# Patient Record
Sex: Female | Born: 1965 | Hispanic: No | Marital: Married | Smoking: Never smoker
Health system: Southern US, Community
[De-identification: ages and names within clinical notes are randomized; demographics above are authoritative.]

## PROBLEM LIST (undated history)

## (undated) DIAGNOSIS — K3533 Acute appendicitis with perforation and localized peritonitis, with abscess: Secondary | ICD-10-CM

## (undated) DIAGNOSIS — K219 Gastro-esophageal reflux disease without esophagitis: Secondary | ICD-10-CM

## (undated) DIAGNOSIS — I959 Hypotension, unspecified: Secondary | ICD-10-CM

## (undated) HISTORY — PX: BRAIN SURGERY: SHX531

## (undated) HISTORY — PX: CHOLECYSTECTOMY: SHX55

---

## 2015-02-15 ENCOUNTER — Inpatient Hospital Stay (HOSPITAL_BASED_OUTPATIENT_CLINIC_OR_DEPARTMENT_OTHER)
Admission: EM | Admit: 2015-02-15 | Discharge: 2015-02-19 | DRG: 340 | Disposition: A | Discharge status: Home / Self Care | Payer: 59 | Attending: General Surgery | Admitting: General Surgery

## 2015-02-15 ENCOUNTER — Encounter (HOSPITAL_BASED_OUTPATIENT_CLINIC_OR_DEPARTMENT_OTHER): Payer: Self-pay | Admitting: *Deleted

## 2015-02-15 DIAGNOSIS — B9620 Unspecified Escherichia coli [E. coli] as the cause of diseases classified elsewhere: Secondary | ICD-10-CM | POA: Diagnosis present

## 2015-02-15 DIAGNOSIS — K353 Acute appendicitis with localized peritonitis: Secondary | ICD-10-CM | POA: Diagnosis not present

## 2015-02-15 DIAGNOSIS — R52 Pain, unspecified: Secondary | ICD-10-CM

## 2015-02-15 DIAGNOSIS — K219 Gastro-esophageal reflux disease without esophagitis: Secondary | ICD-10-CM | POA: Diagnosis present

## 2015-02-15 DIAGNOSIS — K352 Acute appendicitis with generalized peritonitis: Secondary | ICD-10-CM | POA: Diagnosis present

## 2015-02-15 DIAGNOSIS — K3532 Acute appendicitis with perforation and localized peritonitis, without abscess: Secondary | ICD-10-CM

## 2015-02-15 DIAGNOSIS — Z79899 Other long term (current) drug therapy: Secondary | ICD-10-CM

## 2015-02-15 DIAGNOSIS — R1031 Right lower quadrant pain: Secondary | ICD-10-CM | POA: Diagnosis not present

## 2015-02-15 DIAGNOSIS — I959 Hypotension, unspecified: Secondary | ICD-10-CM | POA: Diagnosis present

## 2015-02-15 DIAGNOSIS — K3533 Acute appendicitis with perforation and localized peritonitis, with abscess: Secondary | ICD-10-CM | POA: Insufficient documentation

## 2015-02-15 HISTORY — DX: Hypotension, unspecified: I95.9

## 2015-02-15 HISTORY — DX: Acute appendicitis with perforation, localized peritonitis, and gangrene, with abscess: K35.33

## 2015-02-15 LAB — URINE MICROSCOPIC-ADD ON

## 2015-02-15 LAB — URINALYSIS, ROUTINE W REFLEX MICROSCOPIC
Bilirubin Urine: NEGATIVE
Glucose, UA: NEGATIVE mg/dL
Ketones, ur: 15 mg/dL — AB
LEUKOCYTES UA: NEGATIVE
NITRITE: NEGATIVE
PH: 6.5 (ref 5.0–8.0)
PROTEIN: NEGATIVE mg/dL
Specific Gravity, Urine: 1.007 (ref 1.005–1.030)
Urobilinogen, UA: 1 mg/dL (ref 0.0–1.0)

## 2015-02-15 LAB — PREGNANCY, URINE: PREG TEST UR: NEGATIVE

## 2015-02-15 NOTE — ED Provider Notes (Addendum)
CSN: [id number]     Arrival date & time 02/15/15  4098 History  This chart was scribed for Taylor Murphy Smitty Cords, MD by Freida Busman, ED Scribe. This patient was seen in room MH02/MH02 and the patient's care was started 11:31 PM.    Chief Complaint  Patient presents with  . Abdominal Pain    Patient is a 49 y.o. female presenting with abdominal pain. The history is provided by the patient and a relative. The history is limited by a language barrier (nepali).  Abdominal Pain Pain radiates to:  RUQ, RLQ and R flank Pain severity:  Moderate Onset quality:  Gradual Timing:  Intermittent Progression:  Waxing and waning Chronicity:  Recurrent Context: not alcohol use and not trauma   Relieved by:  Nothing Worsened by:  Nothing tried Associated symptoms: fever and nausea   Associated symptoms: no chest pain, no constipation, no diarrhea, no dysuria and no vomiting   Risk factors: no alcohol abuse      HPI Comments:  Taylor Murphy is a 49 y.o. female who presents to the Emergency Department complaining of intermittent abdominal pain for a few weeks. Pt states her pain starts in her epigastrum and then radiates into the rest of her abdomen sometimes settling in her right abdomen. She reports associated fever with max temp of 100.6. She denies vomiting, constipation, cough and diarrhea. She was evaluated at urgent care for the same and discharged with prilosec.  No alleviating factors noted.   No past medical history on file. Past Surgical History  Procedure Laterality Date  . Brain surgery     No family history on file. History  Substance Use Topics  . Smoking status: Never Smoker   . Smokeless tobacco: Not on file  . Alcohol Use: No   OB History    No data available     Review of Systems  Constitutional: Positive for fever.  Cardiovascular: Negative for chest pain.  Gastrointestinal: Positive for nausea and abdominal pain. Negative for vomiting, diarrhea and constipation.   Genitourinary: Negative for dysuria.  All other systems reviewed and are negative.     Allergies  Review of patient's allergies indicates no known allergies.  Home Medications   Prior to Admission medications   Medication Sig Start Date End Date Taking? Authorizing Provider  acetaminophen (TYLENOL) 325 MG tablet Take 650 mg by mouth every 6 (six) hours as needed.   Yes Historical Provider, MD  omeprazole (PRILOSEC) 20 MG capsule Take 20 mg by mouth daily.   Yes Historical Provider, MD   BP 108/63 mmHg  Pulse 106  Temp(Src) 98.6 F (37 C) (Oral)  Resp 16  Ht  (1.549 m)  Wt 127 lb (57.607 kg)  BMI 24.01 kg/m2  SpO2 98%  LMP 01/13/2015 Physical Exam  Constitutional: She is oriented to person, place, and time. She appears well-developed and well-nourished. No distress.  HENT:  Head: Normocephalic and atraumatic.  Mouth/Throat: Oropharynx is clear and moist.  Eyes: Conjunctivae are normal. Pupils are equal, round, and reactive to light.  Neck: Normal range of motion. Neck supple.  Cardiovascular: Normal rate, regular rhythm and normal heart sounds.   Pulmonary/Chest: Effort normal and breath sounds normal. No respiratory distress.  Abdominal: Soft. She exhibits no mass. Bowel sounds are increased. There is tenderness. There is no rigidity, no rebound, no guarding, no tenderness at McBurney's point and negative Murphy's sign.    Hyperactive bowel sounds  Neurological: She is alert and oriented to person, place, and  time.  Skin: Skin is warm and dry.  Psychiatric: She has a normal mood and affect. Her behavior is normal.  Nursing note and vitals reviewed.   ED Course  Procedures   DIAGNOSTIC STUDIES:  Oxygen Saturation is 98% on RA, normal by my interpretation.    COORDINATION OF CARE:  11:34 PM Will order UA. Discussed treatment plan with pt at bedside and pt agreed to plan.  Labs Review Labs Reviewed  URINALYSIS, ROUTINE W REFLEX MICROSCOPIC  PREGNANCY,  URINE    Imaging Review No results found.   EKG Interpretation None      MDM   Final diagnoses:  None    MDM Reviewed: nursing note and vitals Interpretation: labs (elevated white count, hematuria) Consults: trauma   Results for orders placed or performed during the hospital encounter of 02/15/15  Urinalysis, Routine w reflex microscopic  Result Value Ref Range   Color, Urine YELLOW YELLOW   APPearance CLEAR CLEAR   Specific Gravity, Urine 1.007 1.005 - 1.030   pH 6.5 5.0 - 8.0   Glucose, UA NEGATIVE NEGATIVE mg/dL   Hgb urine dipstick LARGE (A) NEGATIVE   Bilirubin Urine NEGATIVE NEGATIVE   Ketones, ur 15 (A) NEGATIVE mg/dL   Protein, ur NEGATIVE NEGATIVE mg/dL   Urobilinogen, UA 1.0 0.0 - 1.0 mg/dL   Nitrite NEGATIVE NEGATIVE   Leukocytes, UA NEGATIVE NEGATIVE  Pregnancy, urine  Result Value Ref Range   Preg Test, Ur NEGATIVE NEGATIVE  Urine microscopic-add on  Result Value Ref Range   Squamous Epithelial / LPF RARE RARE   WBC, UA 0-2 <3 WBC/hpf   RBC / HPF 21-50 <3 RBC/hpf   Bacteria, UA FEW (A) RARE  CBC with Differential/Platelet  Result Value Ref Range   WBC 12.3 (H) 4.0 - 10.5 K/uL   RBC 3.68 (L) 3.87 - 5.11 MIL/uL   Hemoglobin 11.1 (L) 12.0 - 15.0 g/dL   HCT 16.132.6 (L) 09.636.0 - 04.546.0 %   MCV 88.6 78.0 - 100.0 fL   MCH 30.2 26.0 - 34.0 pg   MCHC 34.0 30.0 - 36.0 g/dL   RDW 40.911.6 81.111.5 - 91.415.5 %   Platelets 224 150 - 400 K/uL   Neutrophils Relative % 79 (H) 43 - 77 %   Neutro Abs 9.8 (H) 1.7 - 7.7 K/uL   Lymphocytes Relative 11 (L) 12 - 46 %   Lymphs Abs 1.4 0.7 - 4.0 K/uL   Monocytes Relative 9 3 - 12 %   Monocytes Absolute 1.1 (H) 0.1 - 1.0 K/uL   Eosinophils Relative 1 0 - 5 %   Eosinophils Absolute 0.1 0.0 - 0.7 K/uL   Basophils Relative 0 0 - 1 %   Basophils Absolute 0.0 0.0 - 0.1 K/uL  Basic metabolic panel  Result Value Ref Range   Sodium 133 (L) 135 - 145 mmol/L   Potassium 3.2 (L) 3.5 - 5.1 mmol/L   Chloride 103 96 - 112 mmol/L   CO2 25  19 - 32 mmol/L   Glucose, Bld 134 (H) 70 - 99 mg/dL   BUN 6 6 - 23 mg/dL   Creatinine, Ser 7.820.61 0.50 - 1.10 mg/dL   Calcium 8.3 (L) 8.4 - 10.5 mg/dL   GFR calc non Af Amer >90 >90 mL/min   GFR calc Af Amer >90 >90 mL/min   Anion gap 5 5 - 15   Ct Renal Stone Study  02/16/2015   CLINICAL DATA:  Right abdominal pain and fever.  Hematuria.  EXAM:  CT ABDOMEN AND PELVIS WITHOUT CONTRAST  TECHNIQUE: Multidetector CT imaging of the abdomen and pelvis was performed following the standard protocol without IV contrast.  COMPARISON:  None.  FINDINGS: There is acute inflammatory change surrounding the appendix. There is a 4 cm collection around the appendiceal tip directly posterior to the ascending colon, with appearances typical of an appendiceal abscess. There is no extraluminal air. There is no bowel obstruction. There are unremarkable appearances of the liver, spleen, pancreas, adrenals and kidneys. The right ureter passes directly alongside the periappendiceal inflammation, probably accounting for the described hematuria.  No significant abnormalities are evident in the lower chest.  IMPRESSION: Appendicitis with 4 cm abscess around the appendiceal tip.  Critical Value/emergent results were called by telephone at the time of interpretation on 02/16/2015 at 12:43 am to Dr. Deanna Artis , who verbally acknowledged these results.   Electronically Signed   By: Ellery Plunk M.D.   On: 02/16/2015 00:43    Medications  vancomycin (VANCOCIN) IVPB 1000 mg/200 mL premix (not administered)  fentaNYL (SUBLIMAZE) injection 50 mcg (50 mcg Intravenous Given 02/16/15 0058)  ondansetron (ZOFRAN) injection 4 mg (4 mg Intravenous Given 02/16/15 0059)  sodium chloride 0.9 % bolus 1,000 mL (0 mLs Intravenous Stopped 02/16/15 0144)  piperacillin-tazobactam (ZOSYN) IVPB 3.375 g (0 g Intravenous Stopped 02/16/15 0144)  care link made multiple attempts to contact [facility] general surgeon and was unable to contact that MD for  an hour    Transfer to [facility] per Dr. Corliss Skains  Dr. Mora Bellman informed of transfer I personally performed the services described in this documentation, which was scribed in my presence. The recorded information has been reviewed and is accurate.    Yong Grieser K Jamariah Tony-Rasch, MD 02/16/15 0150  Called back patient is now going to [facility], Dr. Norlene Campbell aware  Irving Burton at [facility] is aware of cancellation   Roen Macgowan Smitty Cords, MD 02/16/15 [phone]

## 2015-02-15 NOTE — ED Notes (Signed)
MD at bedside. 

## 2015-02-15 NOTE — ED Notes (Signed)
Pt. Reports abd. Pain started approx. Feb. 5th and she has been seen x 2 at urgent care and is still having pain with fever and headache at times.

## 2015-02-16 ENCOUNTER — Encounter (HOSPITAL_BASED_OUTPATIENT_CLINIC_OR_DEPARTMENT_OTHER): Payer: Self-pay | Admitting: Emergency Medicine

## 2015-02-16 ENCOUNTER — Emergency Department (HOSPITAL_BASED_OUTPATIENT_CLINIC_OR_DEPARTMENT_OTHER): Payer: 59

## 2015-02-16 DIAGNOSIS — K352 Acute appendicitis with generalized peritonitis: Secondary | ICD-10-CM | POA: Diagnosis present

## 2015-02-16 DIAGNOSIS — I959 Hypotension, unspecified: Secondary | ICD-10-CM | POA: Diagnosis present

## 2015-02-16 DIAGNOSIS — B9620 Unspecified Escherichia coli [E. coli] as the cause of diseases classified elsewhere: Secondary | ICD-10-CM | POA: Diagnosis present

## 2015-02-16 DIAGNOSIS — Z79899 Other long term (current) drug therapy: Secondary | ICD-10-CM | POA: Diagnosis not present

## 2015-02-16 DIAGNOSIS — K353 Acute appendicitis with localized peritonitis: Secondary | ICD-10-CM | POA: Diagnosis present

## 2015-02-16 DIAGNOSIS — K219 Gastro-esophageal reflux disease without esophagitis: Secondary | ICD-10-CM | POA: Diagnosis present

## 2015-02-16 DIAGNOSIS — R1031 Right lower quadrant pain: Secondary | ICD-10-CM | POA: Diagnosis present

## 2015-02-16 DIAGNOSIS — K3533 Acute appendicitis with perforation and localized peritonitis, with abscess: Secondary | ICD-10-CM | POA: Diagnosis present

## 2015-02-16 LAB — BASIC METABOLIC PANEL
Anion gap: 5 (ref 5–15)
Anion gap: 7 (ref 5–15)
BUN: 6 mg/dL (ref 6–23)
BUN: 5 mg/dL — AB (ref 6–23)
CALCIUM: 8.3 mg/dL — AB (ref 8.4–10.5)
CHLORIDE: 105 mmol/L (ref 96–112)
CO2: 25 mmol/L (ref 19–32)
CO2: 25 mmol/L (ref 19–32)
CREATININE: 0.61 mg/dL (ref 0.50–1.10)
CREATININE: 0.69 mg/dL (ref 0.50–1.10)
Calcium: 8 mg/dL — ABNORMAL LOW (ref 8.4–10.5)
Chloride: 103 mmol/L (ref 96–112)
GFR calc Af Amer: >90 mL/min (ref >90)
GFR calc non Af Amer: >90 mL/min (ref >90)
GLUCOSE: 134 mg/dL — AB (ref 70–99)
Glucose, Bld: 104 mg/dL — ABNORMAL HIGH (ref 70–99)
Potassium: 3.2 mmol/L — ABNORMAL LOW (ref 3.5–5.1)
Potassium: 3.4 mmol/L — ABNORMAL LOW (ref 3.5–5.1)
SODIUM: 133 mmol/L — AB (ref 135–145)
Sodium: 137 mmol/L (ref 135–145)

## 2015-02-16 LAB — CBC WITH DIFFERENTIAL/PLATELET
BASOS PCT: 0 % (ref 0–1)
Basophils Absolute: 0 10*3/uL (ref 0.0–0.1)
EOS ABS: 0.1 10*3/uL (ref 0.0–0.7)
EOS PCT: 1 % (ref 0–5)
HEMATOCRIT: 32.6 % — AB (ref 36.0–46.0)
Hemoglobin: 11.1 g/dL — ABNORMAL LOW (ref 12.0–15.0)
Lymphocytes Relative: 11 % — ABNORMAL LOW (ref 12–46)
Lymphs Abs: 1.4 10*3/uL (ref 0.7–4.0)
MCH: 30.2 pg (ref 26.0–34.0)
MCHC: 34 g/dL (ref 30.0–36.0)
MCV: 88.6 fL (ref 78.0–100.0)
MONO ABS: 1.1 10*3/uL — AB (ref 0.1–1.0)
MONOS PCT: 9 % (ref 3–12)
Neutro Abs: 9.8 10*3/uL — ABNORMAL HIGH (ref 1.7–7.7)
Neutrophils Relative %: 79 % — ABNORMAL HIGH (ref 43–77)
Platelets: 224 10*3/uL (ref 150–400)
RBC: 3.68 MIL/uL — ABNORMAL LOW (ref 3.87–5.11)
RDW: 11.6 % (ref 11.5–15.5)
WBC: 12.3 10*3/uL — ABNORMAL HIGH (ref 4.0–10.5)

## 2015-02-16 LAB — CBC
HCT: 28.8 % — ABNORMAL LOW (ref 36.0–46.0)
Hemoglobin: 9.6 g/dL — ABNORMAL LOW (ref 12.0–15.0)
MCH: 30.1 pg (ref 26.0–34.0)
MCHC: 33.3 g/dL (ref 30.0–36.0)
MCV: 90.3 fL (ref 78.0–100.0)
PLATELETS: 221 10*3/uL (ref 150–400)
RBC: 3.19 MIL/uL — ABNORMAL LOW (ref 3.87–5.11)
RDW: 12 % (ref 11.5–15.5)
WBC: 11.8 10*3/uL — ABNORMAL HIGH (ref 4.0–10.5)

## 2015-02-16 LAB — PROTIME-INR
INR: 1.12 (ref 0.00–1.49)
Prothrombin Time: 14.5 seconds (ref 11.6–15.2)

## 2015-02-16 LAB — APTT: APTT: 30 s (ref 24–37)

## 2015-02-16 MED ORDER — PANTOPRAZOLE SODIUM 40 MG PO TBEC
40.0000 mg | DELAYED_RELEASE_TABLET | Freq: Every day | ORAL | Status: DC
  Administered 2015-02-17 – 2015-02-19 (×3): 40 mg via ORAL
  Filled 2015-02-16 (×4): qty 1

## 2015-02-16 MED ORDER — DIPHENHYDRAMINE HCL 12.5 MG/5ML PO ELIX: 12.5000 mg | ORAL_SOLUTION | Freq: Four times a day (QID) | ORAL | Status: DC | PRN

## 2015-02-16 MED ORDER — DOCUSATE SODIUM 100 MG PO CAPS
100.0000 mg | ORAL_CAPSULE | Freq: Two times a day (BID) | ORAL | Status: DC
  Administered 2015-02-16 – 2015-02-19 (×5): 100 mg via ORAL
  Filled 2015-02-16 (×7): qty 1

## 2015-02-16 MED ORDER — MORPHINE SULFATE 2 MG/ML IJ SOLN
2.0000 mg | Freq: Once | INTRAMUSCULAR | Status: AC
  Administered 2015-02-16: 2 mg via INTRAVENOUS
  Filled 2015-02-16: qty 1

## 2015-02-16 MED ORDER — ONDANSETRON HCL 4 MG/2ML IJ SOLN: 4.0000 mg | Freq: Four times a day (QID) | INTRAMUSCULAR | Status: DC | PRN

## 2015-02-16 MED ORDER — SODIUM CHLORIDE 0.9 % IV SOLN
1.0000 g | INTRAVENOUS | Status: DC
  Administered 2015-02-16 – 2015-02-19 (×4): 1 g via INTRAVENOUS
  Filled 2015-02-16 (×4): qty 1

## 2015-02-16 MED ORDER — SODIUM CHLORIDE 0.9 % IV BOLUS (SEPSIS)
1000.0000 mL | Freq: Once | INTRAVENOUS | Status: AC
  Administered 2015-02-16: 1000 mL via INTRAVENOUS

## 2015-02-16 MED ORDER — ONDANSETRON HCL 4 MG/2ML IJ SOLN
4.0000 mg | Freq: Once | INTRAMUSCULAR | Status: AC
  Administered 2015-02-16: 4 mg via INTRAVENOUS
  Filled 2015-02-16: qty 2

## 2015-02-16 MED ORDER — KCL IN DEXTROSE-NACL 20-5-0.45 MEQ/L-%-% IV SOLN
INTRAVENOUS | Status: DC
  Administered 2015-02-16: 200 mL via INTRAVENOUS
  Administered 2015-02-16: 200 mL/h via INTRAVENOUS
  Administered 2015-02-16: 1000 mL via INTRAVENOUS
  Administered 2015-02-17 (×2): via INTRAVENOUS
  Administered 2015-02-17 – 2015-02-18 (×3): 1000 mL via INTRAVENOUS
  Administered 2015-02-18 – 2015-02-19 (×2): via INTRAVENOUS
  Filled 2015-02-16 (×12): qty 1000

## 2015-02-16 MED ORDER — ACETAMINOPHEN 325 MG PO TABS: 650.0000 mg | ORAL_TABLET | Freq: Four times a day (QID) | ORAL | Status: DC | PRN

## 2015-02-16 MED ORDER — DIPHENHYDRAMINE HCL 50 MG/ML IJ SOLN
12.5000 mg | Freq: Four times a day (QID) | INTRAMUSCULAR | Status: DC | PRN
Start: 2015-02-16 — End: 2015-02-19

## 2015-02-16 MED ORDER — PIPERACILLIN-TAZOBACTAM 3.375 G IVPB 30 MIN
3.3750 g | Freq: Once | INTRAVENOUS | Status: AC
  Administered 2015-02-16: 3.375 g via INTRAVENOUS
  Filled 2015-02-16 (×2): qty 50

## 2015-02-16 MED ORDER — VANCOMYCIN HCL IN DEXTROSE 1-5 GM/200ML-% IV SOLN
1000.0000 mg | Freq: Once | INTRAVENOUS | Status: AC
  Administered 2015-02-16: 1000 mg via INTRAVENOUS
  Filled 2015-02-16: qty 200

## 2015-02-16 MED ORDER — MORPHINE SULFATE 2 MG/ML IJ SOLN
1.0000 mg | INTRAMUSCULAR | Status: DC | PRN
  Administered 2015-02-17 (×2): 2 mg via INTRAVENOUS
  Filled 2015-02-16 (×2): qty 1

## 2015-02-16 MED ORDER — FENTANYL CITRATE 0.05 MG/ML IJ SOLN
50.0000 ug | Freq: Once | INTRAMUSCULAR | Status: AC
  Administered 2015-02-16: 50 ug via INTRAVENOUS
  Filled 2015-02-16: qty 2

## 2015-02-16 NOTE — H&P (Signed)
Taylor Murphy is an 49 y.o. female.   Chief Complaint: Abdominal pain.   HPI:  Pt is a 49 yo F who presented to [facility] with 1-2 weeks of intermittent epigastric abdominal pain, nausea and vomiting.  This did have a gradual ramp up period to where it is now severe and has migrated to the RLQ.    She has had some fevers/.  She denies sick contacts.  She was seen at an urgent care and diagnosed with reflux/gastritis and given prilosec which did not alleviate her symptoms around 1 week ago.  The pain continued to worsen.  Of note, she is from [place].  She and her family have been here 6 months and lost their home in the [place] last year.  She and her family speak english fairly well.    History reviewed. No pertinent past medical history.  Past Surgical History  Procedure Laterality Date  . Brain surgery      History reviewed. No pertinent family history. Social History:  reports that she has never smoked. She does not have any smokeless tobacco history on file. She reports that she does not drink alcohol or use illicit drugs.  Allergies: No Known Allergies  Medications: Tylenol Prilosec   Results for orders placed or performed during the hospital encounter of 02/15/15 (from the past 48 hour(s))  Urinalysis, Routine w reflex microscopic     Status: Abnormal   Collection Time: 02/15/15 11:20 PM  Result Value Ref Range   Color, Urine YELLOW YELLOW   APPearance CLEAR CLEAR   Specific Gravity, Urine 1.007 1.005 - 1.030   pH 6.5 5.0 - 8.0   Glucose, UA NEGATIVE NEGATIVE mg/dL   Hgb urine dipstick LARGE (A) NEGATIVE   Bilirubin Urine NEGATIVE NEGATIVE   Ketones, ur 15 (A) NEGATIVE mg/dL   Protein, ur NEGATIVE NEGATIVE mg/dL   Urobilinogen, UA 1.0 0.0 - 1.0 mg/dL   Nitrite NEGATIVE NEGATIVE   Leukocytes, UA NEGATIVE NEGATIVE  Pregnancy, urine     Status: None   Collection Time: 02/15/15 11:20 PM  Result Value Ref Range   Preg Test, Ur NEGATIVE NEGATIVE    Comment:         THE SENSITIVITY OF THIS METHODOLOGY IS >20 mIU/mL.   Urine microscopic-add on     Status: Abnormal   Collection Time: 02/15/15 11:20 PM  Result Value Ref Range   Squamous Epithelial / LPF RARE RARE   WBC, UA 0-2 <3 WBC/hpf   RBC / HPF 21-50 <3 RBC/hpf   Bacteria, UA FEW (A) RARE  CBC with Differential/Platelet     Status: Abnormal   Collection Time: 02/16/15 12:55 AM  Result Value Ref Range   WBC 12.3 (H) 4.0 - 10.5 K/uL   RBC 3.68 (L) 3.87 - 5.11 MIL/uL   Hemoglobin 11.1 (L) 12.0 - 15.0 g/dL   HCT 32.6 (L) 36.0 - 46.0 %   MCV 88.6 78.0 - 100.0 fL   MCH 30.2 26.0 - 34.0 pg   MCHC 34.0 30.0 - 36.0 g/dL   RDW 11.6 11.5 - 15.5 %   Platelets 224 150 - 400 K/uL   Neutrophils Relative % 79 (H) 43 - 77 %   Neutro Abs 9.8 (H) 1.7 - 7.7 K/uL   Lymphocytes Relative 11 (L) 12 - 46 %   Lymphs Abs 1.4 0.7 - 4.0 K/uL   Monocytes Relative 9 3 - 12 %   Monocytes Absolute 1.1 (H) 0.1 - 1.0 K/uL  Eosinophils Relative 1 0 - 5 %   Eosinophils Absolute 0.1 0.0 - 0.7 K/uL   Basophils Relative 0 0 - 1 %   Basophils Absolute 0.0 0.0 - 0.1 K/uL  Basic metabolic panel     Status: Abnormal   Collection Time: 02/16/15 12:55 AM  Result Value Ref Range   Sodium 133 (L) 135 - 145 mmol/L   Potassium 3.2 (L) 3.5 - 5.1 mmol/L   Chloride 103 96 - 112 mmol/L   CO2 25 19 - 32 mmol/L   Glucose, Bld 134 (H) 70 - 99 mg/dL   BUN 6 6 - 23 mg/dL   Creatinine, Ser 0.61 0.50 - 1.10 mg/dL   Calcium 8.3 (L) 8.4 - 10.5 mg/dL   GFR calc non Af Amer >90 >90 mL/min   GFR calc Af Amer >90 >90 mL/min    Comment: (NOTE) The eGFR has been calculated using the CKD EPI equation. This calculation has not been validated in all clinical situations. eGFR's persistently <90 mL/min signify possible Chronic Kidney Disease.    Anion gap 5 5 - 15   Ct Renal Stone Study  02/16/2015   CLINICAL DATA:  Right abdominal pain and fever.  Hematuria.  EXAM: CT ABDOMEN AND PELVIS WITHOUT CONTRAST  TECHNIQUE: Multidetector CT  imaging of the abdomen and pelvis was performed following the standard protocol without IV contrast.  COMPARISON:  None.  FINDINGS: There is acute inflammatory change surrounding the appendix. There is a 4 cm collection around the appendiceal tip directly posterior to the ascending colon, with appearances typical of an appendiceal abscess. There is no extraluminal air. There is no bowel obstruction. There are unremarkable appearances of the liver, spleen, pancreas, adrenals and kidneys. The right ureter passes directly alongside the periappendiceal inflammation, probably accounting for the described hematuria.  No significant abnormalities are evident in the lower chest.  IMPRESSION: Appendicitis with 4 cm abscess around the appendiceal tip.  Critical Value/emergent results were called by telephone at the time of interpretation on 02/16/2015 at 12:43 am to Dr. Maggie Schwalbe , who verbally acknowledged these results.   Electronically Signed   By: Andreas Newport M.D.   On: 02/16/2015 00:43    Review of Systems  Constitutional: Positive for fever.  HENT: Negative.   Eyes: Negative.   Respiratory: Negative.   Cardiovascular: Negative.   Gastrointestinal: Positive for nausea and abdominal pain (Epigastric/RLQ). Negative for vomiting.  Genitourinary: Positive for flank pain (Right). Negative for dysuria.  Musculoskeletal: Negative.   Neurological: Negative.   Endo/Heme/Allergies: Negative.   Psychiatric/Behavioral: Negative.     Blood pressure 102/58, pulse 102, temperature 98.7 F (37.1 C), temperature source Oral, resp. rate 18, height '5\' 1"'  (1.549 m), weight 127 lb (57.607 kg), last menstrual period 01/13/2015, SpO2 97 %. Physical Exam   Assessment/Plan Acute appendicitis with abscess IVF IV Antibiotics NPO Probable perc drain followed by interval appy, but sometimes patient requires drain, then appy this admission.  Will discuss.      Onofrio Klemp 02/16/2015, 4:49 AM

## 2015-02-16 NOTE — ED Notes (Signed)
Family notified of change of hospital

## 2015-02-16 NOTE — Consult Note (Signed)
Reason for consult: right pelvic abscess drainage  Referring Physician(s): CCS  History of Present Illness: Taylor Murphy is a 49 y.o. female from [place] with 1-2 week history of abdominal pain, nausea/vomiting, intermittent fevers treated initially as OP for reflux/gastritis but did not resolve. She now presents with persistent worsening RLQ pain, mild leukocytosis and imaging studies revealing appendicitis with associated abscess. Request now received for CT guided drainage of the RLQ abscess.  History reviewed. No pertinent past medical history.  Past Surgical History  Procedure Laterality Date  . Brain surgery      Allergies: Review of patient's allergies indicates no known allergies.  Medications: Prior to Admission medications   Medication Sig Start Date End Date Taking? Authorizing Provider  acetaminophen (TYLENOL) 325 MG tablet Take 650 mg by mouth every 6 (six) hours as needed.   Yes Historical Provider, MD  omeprazole (PRILOSEC) 20 MG capsule Take 20 mg by mouth daily.   Yes Historical Provider, MD  pantoprazole (PROTONIX) 40 MG tablet Take 40 mg by mouth daily.   Yes Historical Provider, MD    History reviewed. No pertinent family history.  History   Social History  . Marital Status: Married    Spouse Name: N/A  . Number of Children: N/A  . Years of Education: N/A   Social History Main Topics  . Smoking status: Never Smoker   . Smokeless tobacco: Not on file  . Alcohol Use: No  . Drug Use: No  . Sexual Activity: Not on file   Other Topics Concern  . None   Social History Narrative      Review of Systems   only c/o at present is mod RLQ pain; denies N/V/resp difficulties  Vital Signs: BP 100/54 mmHg  Pulse 99  Temp(Src) 99.2 F (37.3 C) (Oral)  Resp 20  Ht  (1.549 m)  Wt 127 lb (57.607 kg)  BMI 24.01 kg/m2  SpO2 98%  LMP 01/13/2015  Physical Exam pt awake/alert; chest- CTA bilat; heart- RRR; abd- soft,+BS, mod RLQ tenderness to palp  with some rad to LLQ; ext- FROM, no edema  Imaging: Ct Renal Stone Study  02/16/2015   CLINICAL DATA:  Right abdominal pain and fever.  Hematuria.  EXAM: CT ABDOMEN AND PELVIS WITHOUT CONTRAST  TECHNIQUE: Multidetector CT imaging of the abdomen and pelvis was performed following the standard protocol without IV contrast.  COMPARISON:  None.  FINDINGS: There is acute inflammatory change surrounding the appendix. There is a 4 cm collection around the appendiceal tip directly posterior to the ascending colon, with appearances typical of an appendiceal abscess. There is no extraluminal air. There is no bowel obstruction. There are unremarkable appearances of the liver, spleen, pancreas, adrenals and kidneys. The right ureter passes directly alongside the periappendiceal inflammation, probably accounting for the described hematuria.  No significant abnormalities are evident in the lower chest.  IMPRESSION: Appendicitis with 4 cm abscess around the appendiceal tip.  Critical Value/emergent results were called by telephone at the time of interpretation on 02/16/2015 at 12:43 am to Dr. Deanna Artis , who verbally acknowledged these results.   Electronically Signed   By: Ellery Plunk M.D.   On: 02/16/2015 00:43    Labs:  CBC:  Recent Labs  02/16/15 0055 02/16/15 0715  WBC 12.3* 11.8*  HGB 11.1* 9.6*  HCT 32.6* 28.8*  PLT 224 221    COAGS:  Recent Labs  02/16/15 0915  INR 1.12  APTT 30    BMP:  Recent Labs  02/16/15 0055 02/16/15 0715  NA 133* 137  K 3.2* 3.4*  CL 103 105  CO2 25 25  GLUCOSE 134* 104*  BUN 6 5*  CALCIUM 8.3* 8.0*  CREATININE 0.61 0.69  GFRNONAA >90 >90  GFRAA >90 >90    LIVER FUNCTION TESTS: No results for input(s): BILITOT, AST, ALT, ALKPHOS, PROT, ALBUMIN in the last 8760 hours.  TUMOR MARKERS: No results for input(s): AFPTM, CEA, CA199, CHROMGRNA in the last 8760 hours.  Assessment and Plan: Pt with hx abd pain- primarily RLQ, mild temp  elevation,leukocytosis, imaging findings c/w appendicitis with associated abscess. Imaging studies were reviewed by Dr. Loreta AveWagner. Majority of RLQ collection is phelgmonous with only small amt fluid appreciated. Percutaneous drainage at this time may be difficult; however, CCS has requested attempt to hopefully offset need for emergent surgery.  Details/risks of procedure (including but not limited to, internal bleeding, bowel injury, increased abd pain/worsening of infection and inability to adequately drain collection with need for possible surgery ) d/w pt/family with their understanding and consent. BP still a little soft so should hydrate sufficiently preprocedure.      Signed: Chinita PesterALLRED,D KEVIN 02/16/2015, 6:02 PM  I spent a total of 20 minutes face to face in clinical consultation, greater than 50% of which was counseling/coordinating care for appendiceal abscess drainage

## 2015-02-16 NOTE — ED Notes (Signed)
Dr. Donell BeersByerly made aware of pt's arrival to department.

## 2015-02-16 NOTE — ED Notes (Signed)
Bed: WA17 Expected date:  Expected time:  Means of arrival:  Comments: Transfer from med center, Ruptured appendix

## 2015-02-16 NOTE — Progress Notes (Signed)
Pt was seen by Dr. Donell BeersByerly early this am.  Pending perc drain and conservative management of her acute perforated appendicitis with abscess.  She is still quite tender in her RLQ, but improved with pain meds.  Hopefully she will improve conservatively, but if she acutely worsens or fails to improve may require ex lap.  Husband at bedside.   Aris GeorgiaMegan Dort, PA-C [facility] [phone] @T @   Addendum: Talked to IR and they are not able to place IR drain due to the abscess being loculated.  They recommend repeating CT in a few days and re-assessing.  The nurse brings to my attention that she is hypotensive.  I will give her a fluid bolus and notify Dr. Ezzard StandingNewman.  Agree with above. I talked to Dr. Ardelle AntonWagoner.  I think that she would be best served with an attempted perc drain.  I understand from Dr. Ardelle AntonWagoner that this may be less that ideal, but I think that it is worth the attempt.  Discussed with patient and husband.  Ovidio Kinavid Mckynleigh Mussell, MD, [facility] Pager: [phone] Office phone:  [phone]

## 2015-02-16 NOTE — ED Notes (Signed)
[facility]

## 2015-02-17 ENCOUNTER — Inpatient Hospital Stay (HOSPITAL_COMMUNITY): Payer: 59

## 2015-02-17 ENCOUNTER — Encounter (HOSPITAL_COMMUNITY): Payer: Self-pay | Admitting: General Surgery

## 2015-02-17 LAB — BASIC METABOLIC PANEL
Anion gap: 8 (ref 5–15)
BUN: <5 mg/dL — ABNORMAL LOW (ref 6–23)
CO2: 24 mmol/L (ref 19–32)
Calcium: 8.2 mg/dL — ABNORMAL LOW (ref 8.4–10.5)
Chloride: 104 mmol/L (ref 96–112)
Creatinine, Ser: 0.58 mg/dL (ref 0.50–1.10)
GFR calc Af Amer: >90 mL/min (ref >90)
GFR calc non Af Amer: >90 mL/min (ref >90)
GLUCOSE: 160 mg/dL — AB (ref 70–99)
POTASSIUM: 4 mmol/L (ref 3.5–5.1)
Sodium: 136 mmol/L (ref 135–145)

## 2015-02-17 LAB — CBC
HCT: 28.2 % — ABNORMAL LOW (ref 36.0–46.0)
Hemoglobin: 9.3 g/dL — ABNORMAL LOW (ref 12.0–15.0)
MCH: 29.9 pg (ref 26.0–34.0)
MCHC: 33 g/dL (ref 30.0–36.0)
MCV: 90.7 fL (ref 78.0–100.0)
Platelets: 207 10*3/uL (ref 150–400)
RBC: 3.11 MIL/uL — ABNORMAL LOW (ref 3.87–5.11)
RDW: 11.8 % (ref 11.5–15.5)
WBC: 9.4 10*3/uL (ref 4.0–10.5)

## 2015-02-17 MED ORDER — MIDAZOLAM HCL 2 MG/2ML IJ SOLN
INTRAMUSCULAR | Status: AC
  Filled 2015-02-17: qty 6

## 2015-02-17 MED ORDER — FENTANYL CITRATE 0.05 MG/ML IJ SOLN
INTRAMUSCULAR | Status: AC
  Filled 2015-02-17: qty 4

## 2015-02-17 MED ORDER — FENTANYL CITRATE 0.05 MG/ML IJ SOLN
INTRAMUSCULAR | Status: AC | PRN
  Administered 2015-02-17: 25 ug via INTRAVENOUS

## 2015-02-17 MED ORDER — MIDAZOLAM HCL 2 MG/2ML IJ SOLN
INTRAMUSCULAR | Status: AC | PRN
  Administered 2015-02-17: 1 mg via INTRAVENOUS
  Administered 2015-02-17: 0.5 mg via INTRAVENOUS
  Administered 2015-02-17 (×2): 1 mg via INTRAVENOUS

## 2015-02-17 NOTE — Procedures (Signed)
Interventional Radiology Procedure Note  Procedure: 52F drain placed into per-appendiceal abscess.  10 mL thick purulent fluid aspirated and sent for culture.  To JP bulb. Complications: None Recommendations: - JP drain to suction - Follow cx - F/U IR drain clinic 2-3 weeks with repeat CT abd/pelvis w/ contrast and drain injection under fluoro  Signed,  Sterling BigHeath K. McCullough, MD

## 2015-02-17 NOTE — Progress Notes (Addendum)
[facility] Note     Subjective: Pt feels some better today.  No N/V, abdominal pain slightly improved.  Less dizziness than yesterday.  Ambulating in the halls well.  Not thirsty/hungry.    Objective: Vital signs in last 24 hours: Temp:  [97.5 F (36.4 C)-99.2 F (37.3 C)] 97.5 F (36.4 C) (02/18 0600) Pulse Rate:  [86-99] 86 (02/18 0600) Resp:  [16-20] 16 (02/18 0600) BP: (78-100)/(43-54) 88/52 mmHg (02/18 0600) SpO2:  [97 %-100 %] 99 % (02/18 0600) Last BM Date: 02/15/15  Intake/Output from previous day: 02/17 0701 - 02/18 0700 In: 3660 [I.V.:3660] Out: 0  Intake/Output this shift:    PE: Gen:  Alert, NAD, pleasant Abd: Soft, ND, tender in RLQ, +BS, no HSM   Lab Results:   Recent Labs  02/16/15 0715 02/17/15 0500  WBC 11.8* 9.4  HGB 9.6* 9.3*  HCT 28.8* 28.2*  PLT 221 207   BMET  Recent Labs  02/16/15 0715 02/17/15 0500  NA 137 136  K 3.4* 4.0  CL 105 104  CO2 25 24  GLUCOSE 104* 160*  BUN 5* <5*  CREATININE 0.69 0.58  CALCIUM 8.0* 8.2*   PT/INR  Recent Labs  02/16/15 0915  LABPROT 14.5  INR 1.12   CMP     Component Value Date/Time   NA 136 02/17/2015 0500   K 4.0 02/17/2015 0500   CL 104 02/17/2015 0500   CO2 24 02/17/2015 0500   GLUCOSE 160* 02/17/2015 0500   BUN <5* 02/17/2015 0500   CREATININE 0.58 02/17/2015 0500   CALCIUM 8.2* 02/17/2015 0500   GFRNONAA >90 02/17/2015 0500   GFRAA >90 02/17/2015 0500   Lipase  No results found for: LIPASE     Studies/Results: Ct Renal Stone Study  02/16/2015   CLINICAL DATA:  Right abdominal pain and fever.  Hematuria.  EXAM: CT ABDOMEN AND PELVIS WITHOUT CONTRAST  TECHNIQUE: Multidetector CT imaging of the abdomen and pelvis was performed following the standard protocol without IV contrast.  COMPARISON:  None.  FINDINGS: There is acute inflammatory change surrounding the appendix. There is a 4 cm collection around the appendiceal tip directly posterior to the  ascending colon, with appearances typical of an appendiceal abscess. There is no extraluminal air. There is no bowel obstruction. There are unremarkable appearances of the liver, spleen, pancreas, adrenals and kidneys. The right ureter passes directly alongside the periappendiceal inflammation, probably accounting for the described hematuria.  No significant abnormalities are evident in the lower chest.  IMPRESSION: Appendicitis with 4 cm abscess around the appendiceal tip.  Critical Value/emergent results were called by telephone at the time of interpretation on 02/16/2015 at 12:43 am to Dr. Deanna ArtisAPRIL Murphy , who verbally acknowledged these results.   Electronically Signed   By: Taylor [facility] M.D.   On: 02/16/2015 00:43    Anti-infectives: Anti-infectives    Start     Dose/Rate Route Frequency Ordered Stop   02/16/15 0800  ertapenem (INVANZ) 1 g in sodium chloride 0.9 % 50 mL IVPB     1 g 100 mL/hr over 30 Minutes Intravenous Every 24 hours 02/16/15 0613     02/16/15 0045  vancomycin (VANCOCIN) IVPB 1000 mg/200 mL premix     1,000 mg 200 mL/hr over 60 Minutes Intravenous  Once 02/16/15 0042 02/16/15 0402   02/16/15 0045  piperacillin-tazobactam (ZOSYN) IVPB 3.375 g     3.375 g 100 mL/hr over 30 Minutes Intravenous  Once 02/16/15 0042 02/16/15 0144  Assessment/Plan Acute appendicitis with perforation & abscess Hypotension - she normally runs 100/70 at baseline  Plan: 1.  Red. IVF, pain control, antiemetics, antibiotics (Invanz) 2.  Ambulate and IS 3.  SCD's and DVT proph on hold for procedure 4.  IR will attempt to proceed with drain placement 5.  Hopefully the drain will help her and we can proceed with our original plan of interval appendectomy once the abscess is improved 6.  May be able to have sips of clears after IR procedure 7.  Follow labs   LOS: 1 day   Taylor Murphy, Taylor Murphy 02/17/2015, 7:41 AM Pager: [phone]  Agree with above. Still has palpable mass in RLQ that is  tender.  It seems to be about the same as yesterday. She is for IR drainage at 1300 today. Husband in room.  Discussed plan.  Taylor Kin, MD, [facility] Pager: [phone] Office phone:  [phone]

## 2015-02-17 NOTE — Progress Notes (Signed)
Pt. Blood pressure is low  from 84/45 pt. Is asymptomatic just light dizziness when getting out of the bed at @ 0100 tried to recheck it manually and I get 80/40 . Page the on call Dr. And ordered to keep the IV flow rate which is 200 ml/h r. Re-checked BP at 0500 and its 78/43.

## 2015-02-17 NOTE — Sedation Documentation (Signed)
Pt being marked, prepped and draped.

## 2015-02-18 LAB — BASIC METABOLIC PANEL
Anion gap: 8 (ref 5–15)
BUN: <5 mg/dL — ABNORMAL LOW (ref 6–23)
CHLORIDE: 105 mmol/L (ref 96–112)
CO2: 25 mmol/L (ref 19–32)
Calcium: 8.6 mg/dL (ref 8.4–10.5)
Creatinine, Ser: 0.58 mg/dL (ref 0.50–1.10)
GFR calc Af Amer: >90 mL/min (ref >90)
GLUCOSE: 123 mg/dL — AB (ref 70–99)
POTASSIUM: 4.1 mmol/L (ref 3.5–5.1)
SODIUM: 138 mmol/L (ref 135–145)

## 2015-02-18 LAB — CBC
HEMATOCRIT: 28.7 % — AB (ref 36.0–46.0)
HEMOGLOBIN: 9.6 g/dL — AB (ref 12.0–15.0)
MCH: 30.3 pg (ref 26.0–34.0)
MCHC: 33.4 g/dL (ref 30.0–36.0)
MCV: 90.5 fL (ref 78.0–100.0)
Platelets: 228 10*3/uL (ref 150–400)
RBC: 3.17 MIL/uL — AB (ref 3.87–5.11)
RDW: 11.6 % (ref 11.5–15.5)
WBC: 5.4 10*3/uL (ref 4.0–10.5)

## 2015-02-18 MED ORDER — ENOXAPARIN SODIUM 40 MG/0.4ML ~~LOC~~ SOLN
40.0000 mg | SUBCUTANEOUS | Status: DC
  Administered 2015-02-18 – 2015-02-19 (×2): 40 mg via SUBCUTANEOUS
  Filled 2015-02-18 (×3): qty 0.4

## 2015-02-18 NOTE — Progress Notes (Addendum)
[facility] Progress Note     Subjective: Pt doing much better.  Pain is minimal, but tenderness around drain site.  No N/V, thirsty.  Had a BM on 2/16, having flatus.  Drain with bloody drainage.  Objective: Vital signs in last 24 hours: Temp:  [98.1 F (36.7 C)-99 F (37.2 C)] 98.1 F (36.7 C) (02/19 16100613) Pulse Rate:  [74-91] 74 (02/19 0613) Resp:  [16-27] 18 (02/19 0613) BP: (81-100)/(45-62) 98/50 mmHg (02/19 0613) SpO2:  [97 %-100 %] 98 % (02/19 0613) Last BM Date: 02/15/15  Intake/Output from previous day: 02/18 0701 - 02/19 0700 In: 4070.8 [I.V.:3670.8; IV Piggyback:400] Out: 1830 [Urine:1800; Drains:30] Intake/Output this shift:    PE: Gen:  Alert, NAD, pleasant Abd: Soft, mildly tender around drain site, ND, +BS, no HSM, drain with sanguinous purulent drainage, no abdominal scars noted   Lab Results:   Recent Labs  02/17/15 0500 02/18/15 0527  WBC 9.4 5.4  HGB 9.3* 9.6*  HCT 28.2* 28.7*  PLT 207 228   BMET  Recent Labs  02/17/15 0500 02/18/15 0527  NA 136 138  K 4.0 4.1  CL 104 105  CO2 24 25  GLUCOSE 160* 123*  BUN <5* <5*  CREATININE 0.58 0.58  CALCIUM 8.2* 8.6   PT/INR  Recent Labs  02/16/15 0915  LABPROT 14.5  INR 1.12   CMP     Component Value Date/Time   NA 138 02/18/2015 0527   K 4.1 02/18/2015 0527   CL 105 02/18/2015 0527   CO2 25 02/18/2015 0527   GLUCOSE 123* 02/18/2015 0527   BUN <5* 02/18/2015 0527   CREATININE 0.58 02/18/2015 0527   CALCIUM 8.6 02/18/2015 0527   GFRNONAA >90 02/18/2015 0527   GFRAA >90 02/18/2015 0527   Lipase  No results found for: LIPASE     Studies/Results: Ct Image Guided Drainage By Percutaneous Catheter  02/17/2015   CLINICAL DATA:  49 year old female with acute appendicitis and periappendiceal abscess. Percutaneous drainage is warranted two temporize patient until interval appendectomy.  EXAM: CT IMAGE GUIDED DRAINAGE BY PERCUTANEOUS CATHETER  Date: 02/17/2015  PROCEDURE:  1. CT-guided drain placement Interventional Radiologist:  Sterling BigHeath K. McCullough, MD  ANESTHESIA/SEDATION: Moderate (conscious) sedation was used. 3.5 mg Versed, 25 mcg Fentanyl were administered intravenously. The patient's vital signs were monitored continuously by radiology nursing throughout the procedure.  Sedation Time: 19 minutes  MEDICATIONS: None additional  TECHNIQUE: Informed consent was obtained from the patient following explanation of the procedure, risks, benefits and alternatives. The patient understands, agrees and consents for the procedure. All questions were addressed. A time out was performed.  Patient was placed in the left lateral decubitus position. A planning axial CT scan was performed. The parity appendiceal fluid collection was identified. A suitable skin entry site was selected and marked. The region was sterilely prepped and draped in standard fashion using Betadine skin prep. Local anesthesia was attained by infiltration with 1% lidocaine. Under intermittent CT fluoroscopic guidance, an 18 gauge introducer needle was advanced into the fluid collection. Care was taken to remain posterior to the adjacent cecum. A wire was then coiled within the fluid collection and the tract dilated to 12 [place]. Ultimately, a Adriana SimasCook 12 [place] all-purpose drainage catheter was advanced over the wire or and formed within the fluid collection. Approximately 10 mL of frankly purulent thick material was aspirated. The material was sent for culture.  The tube was gently flushed and connected to JP bulb suction. The tube was then secured  to the skin with 0 Prolene suture and an adhesive fixation device. The patient tolerated the procedure well.  COMPLICATIONS: None  IMPRESSION: 1. Successful placement of a 12 French drain into the periappendiceal abscess collection with aspiration of 10 mL frankly purulent, very thick material. Aspirated fluid was sent for culture. 2. Drain left to JP bulb suction.  PLAN: 1.  Maintain drain to JP bulb suction. 2. Once drainage is < 15 mL/day for 2 days, recommend repeat CT scan of the abdomen/pelvis with contrast material as well as drain injection under fluoroscopy prior to removal to exclude residual undrained abscess and fistulous communication with the appendiceal or cecal lumens. Patient can be followed in the IR drain clinic in 2-3 weeks. Signed,  Sterling Big, MD  [facility]  [facility]   Electronically Signed   By: Malachy Moan M.D.   On: 02/17/2015 15:00    Anti-infectives: Anti-infectives    Start     Dose/Rate Route Frequency Ordered Stop   02/16/15 0800  ertapenem (INVANZ) 1 g in sodium chloride 0.9 % 50 mL IVPB     1 g 100 mL/hr over 30 Minutes Intravenous Every 24 hours 02/16/15 0613     02/16/15 0045  vancomycin (VANCOCIN) IVPB 1000 mg/200 mL premix     1,000 mg 200 mL/hr over 60 Minutes Intravenous  Once 02/16/15 0042 02/16/15 0402   02/16/15 0045  piperacillin-tazobactam (ZOSYN) IVPB 3.375 g     3.375 g 100 mL/hr over 30 Minutes Intravenous  Once 02/16/15 0042 02/16/15 0144       Assessment/Plan Acute appendicitis with perforation & abscess  Perc drain of abscess - 02/17/2015 Hypotension - she normally runs 100/70 at baseline  Plan: 1. Start clears, advance to fulls at dinner, Red. IVF, pain control, antiemetics, antibiotics (Invanz Day #3) 2. Ambulate and IS 3. SCD's and start lovenox 4. IR had successfully drain placement and she has had about 65mL/24hr of sanguinous purulent drainage.  She is clinically much improved today. 5. Hopefully the drain will help her and we can proceed with our original plan of interval appendectomy once the abscess is improved 6. WBC normalized 7. Home tomorrow or Sunday likely with drain, will need OP follow up with IR drain clinic    LOS: 2 days    [place], [facility] 02/18/2015, 7:42 AM Pager: [phone]  Agree with above. It looks like Dr.  Archer Asa did get into an abscess (there was some discussion pre procedure whether this was more of a phlegmon). She looks good.  Husband in room.  Ovidio Kin, MD, [facility] Pager: [phone] Office phone:  [phone]

## 2015-02-18 NOTE — Discharge Instructions (Signed)
Laparoscopic Appendectomy Appendectomy is surgery to remove the appendix. Laparoscopic surgery uses several small cuts (incisions) instead of one large incision. Laparoscopic surgery offers a shorter recovery time and less discomfort. LET YOUR CAREGIVER KNOW ABOUT:  Allergies to food or medicine.  Medicines taken, including vitamins, dietary supplements, herbs, eyedrops, over-the-counter medicines, and creams.  Use of steroids (by mouth or creams).  Previous problems with anesthetics or numbing medicines.  History of bleeding problems or blood clots.  Previous surgery.  Other health problems, including diabetes, heart problems, lung problems, and kidney problems.  Possibility of pregnancy, if this applies. RISKS AND COMPLICATIONS  Infection. A germ starts growing in the wound. This can usually be treated with antibiotics. In some cases, the wound will need to be opened and cleaned.  Bleeding.  Damage to other organs.  Sores (abscesses).  Chronic pain at the incision sites. This is defined as pain that lasts for more than 3 months.  Blood clots in the legs that may rarely travel to the lungs.  Infection in the lungs (pneumonia). BEFORE THE PROCEDURE Appendectomy is usually performed immediately after an inflamed appendix (appendicitis) is diagnosed. No preparation is necessary ahead of this procedure. PROCEDURE  You will be given medicine that makes you sleep (general anesthetic). After you are asleep, a flexible tube (catheter) may be inserted into your bladder to drain your urine during surgery. The tube is removed before you wake up after surgery. When you are asleep, carbondioxide gas will be used to inflate your abdomen. This will allow your surgeon to see inside your abdomen and perform your surgery. Three small incisions will be made in your abdomen. Your surgeon will insert a thin, lighted tube (laparoscope) through one of the incisions. Your surgeon will look through the  laparoscope while performing the surgery. Other tools will be inserted through the other incisions. Laparoscopic procedures may not be appropriate when:  There is major scarring from a previous surgery.  The patient has bleeding disorders.  A pregnancy is near term.  There are other conditions which make the laparoscopic procedure impossible, such as an advanced infection or a ruptured appendix. If your surgeon feels it is not safe to continue with the laparoscopic procedure, he or she will perform an open surgery instead. This gives the surgeon a larger view and more space to work. Open surgery requires a longer recovery time. After your appendix is removed, your incisions will be closed with stitches (sutures) or skin adhesive. AFTER THE PROCEDURE You will be taken to a recovery room. When the anesthesia has worn off, you will be returned to your hospital room. You will be given pain medicines to keep you comfortable. Ask your caregiver how long your hospital stay will be. Document Released: 07/31/2004 Document Revised: 03/10/2012 Document Reviewed: 06/26/2011 [facility] Patient Information 2015 [place], [place]. This information is not intended to replace advice given to you by your health care provider. Make sure you discuss any questions you have with your health care provider. Appendicitis Appendicitis is when the appendix is swollen (inflamed). The inflammation can lead to developing a hole (perforation) and a collection of pus (abscess). CAUSES  There is not always an obvious cause of appendicitis. Sometimes it is caused by an obstruction in the appendix. The obstruction can be caused by:  A small, hard, pea-sized ball of stool (fecalith).  Enlarged lymph glands in the appendix. SYMPTOMS   Pain around your belly button (navel) that moves toward your lower right belly (abdomen). The pain can  become more severe and sharp as time passes.  Tenderness in the lower right abdomen. Pain gets  worse if you cough or make a sudden movement.  Feeling sick to your stomach (nauseous).  Throwing up (vomiting).  Loss of appetite.  Fever.  Constipation.  Diarrhea.  Generally not feeling well. DIAGNOSIS   Physical exam.  Blood tests.  Urine test.  X-rays or a CT scan may confirm the diagnosis. TREATMENT  Once the diagnosis of appendicitis is made, the most common treatment is to remove the appendix as soon as possible. This procedure is called appendectomy. In an open appendectomy, a cut (incision) is made in the lower right abdomen and the appendix is removed. In a laparoscopic appendectomy, usually 3 small incisions are made. Long, thin instruments and a camera tube are used to remove the appendix. Most patients go home in 24 to 48 hours after appendectomy. In some situations, the appendix may have already perforated and an abscess may have formed. The abscess may have a "wall" around it as seen on a CT scan. In this case, a drain may be placed into the abscess to remove fluid, and you may be treated with antibiotic medicines that kill germs. The medicine is given through a tube in your vein (IV). Once the abscess has resolved, it may or may not be necessary to have an appendectomy. You may need to stay in the hospital longer than 48 hours. Document Released: 12/17/2005 Document Revised: 06/17/2012 Document Reviewed: 03/14/2010 [facility] Patient Information 2015 [place], [place]. This information is not intended to replace advice given to you by your health care provider. Make sure you discuss any questions you have with your health care provider.

## 2015-02-18 NOTE — Progress Notes (Signed)
Referring Physician(s): [facility]  Subjective:  Appendiceal abscess Drain placed 2/18 Feeling some better   Allergies: Review of patient's allergies indicates no known allergies.  Medications: Prior to Admission medications   Medication Sig Start Date End Date Taking? Authorizing Provider  acetaminophen (TYLENOL) 325 MG tablet Take 650 mg by mouth every 6 (six) hours as needed.   Yes Historical Provider, MD  omeprazole (PRILOSEC) 20 MG capsule Take 20 mg by mouth daily.   Yes Historical Provider, MD  pantoprazole (PROTONIX) 40 MG tablet Take 40 mg by mouth daily.   Yes Historical Provider, MD     Vital Signs: BP 98/50 mmHg  Pulse 74  Temp(Src) 98.1 F (36.7 C) (Oral)  Resp 18  Ht 5\' 1"  (1.549 m)  Wt 57.607 kg (127 lb)  BMI 24.01 kg/m2  SpO2 98%  LMP 01/14/2015  Physical Exam  Abdominal: Soft. Bowel sounds are normal. There is no tenderness.  Abscess drain site is sl tender No bleeding  Clean and dry Output bloody and purulent 30 cc yesterday 20 cc in JP Flushed well Cx: Gr- rods Wbc 5.4 afeb    Imaging: Ct Renal Stone Study  02/16/2015   CLINICAL DATA:  Right abdominal pain and fever.  Hematuria.  EXAM: CT ABDOMEN AND PELVIS WITHOUT CONTRAST  TECHNIQUE: Multidetector CT imaging of the abdomen and pelvis was performed following the standard protocol without IV contrast.  COMPARISON:  None.  FINDINGS: There is acute inflammatory change surrounding the appendix. There is a 4 cm collection around the appendiceal tip directly posterior to the ascending colon, with appearances typical of an appendiceal abscess. There is no extraluminal air. There is no bowel obstruction. There are unremarkable appearances of the liver, spleen, pancreas, adrenals and kidneys. The right ureter passes directly alongside the periappendiceal inflammation, probably accounting for the described hematuria.  No significant abnormalities are evident in the lower chest.  IMPRESSION: Appendicitis with  4 cm abscess around the appendiceal tip.  Critical Value/emergent results were called by telephone at the time of interpretation on 02/16/2015 at 12:43 am to Dr. Deanna ArtisAPRIL PALUMBO-RASCH , who verbally acknowledged these results.   Electronically Signed   By: Ellery Plunkaniel R Mitchell M.D.   On: 02/16/2015 00:43   Ct Image Guided Drainage By Percutaneous Catheter  02/17/2015   CLINICAL DATA:  49 year old female with acute appendicitis and periappendiceal abscess. Percutaneous drainage is warranted two temporize patient until interval appendectomy.  EXAM: CT IMAGE GUIDED DRAINAGE BY PERCUTANEOUS CATHETER  Date: 02/17/2015  PROCEDURE: 1. CT-guided drain placement Interventional Radiologist:  Sterling BigHeath K. McCullough, MD  ANESTHESIA/SEDATION: Moderate (conscious) sedation was used. 3.5 mg Versed, 25 mcg Fentanyl were administered intravenously. The patient's vital signs were monitored continuously by radiology nursing throughout the procedure.  Sedation Time: 19 minutes  MEDICATIONS: None additional  TECHNIQUE: Informed consent was obtained from the patient following explanation of the procedure, risks, benefits and alternatives. The patient understands, agrees and consents for the procedure. All questions were addressed. A time out was performed.  Patient was placed in the left lateral decubitus position. A planning axial CT scan was performed. The parity appendiceal fluid collection was identified. A suitable skin entry site was selected and marked. The region was sterilely prepped and draped in standard fashion using Betadine skin prep. Local anesthesia was attained by infiltration with 1% lidocaine. Under intermittent CT fluoroscopic guidance, an 18 gauge introducer needle was advanced into the fluid collection. Care was taken to remain posterior to the adjacent cecum. A wire was  then coiled within the fluid collection and the tract dilated to 12 [place]. Ultimately, a Adriana Simas 12 [place] all-purpose drainage catheter was advanced over  the wire or and formed within the fluid collection. Approximately 10 mL of frankly purulent thick material was aspirated. The material was sent for culture.  The tube was gently flushed and connected to JP bulb suction. The tube was then secured to the skin with 0 Prolene suture and an adhesive fixation device. The patient tolerated the procedure well.  COMPLICATIONS: None  IMPRESSION: 1. Successful placement of a 12 French drain into the periappendiceal abscess collection with aspiration of 10 mL frankly purulent, very thick material. Aspirated fluid was sent for culture. 2. Drain left to JP bulb suction.  PLAN: 1. Maintain drain to JP bulb suction. 2. Once drainage is < 15 mL/day for 2 days, recommend repeat CT scan of the abdomen/pelvis with contrast material as well as drain injection under fluoroscopy prior to removal to exclude residual undrained abscess and fistulous communication with the appendiceal or cecal lumens. Patient can be followed in the IR drain clinic in 2-3 weeks. Signed,  Sterling Big, MD  [facility]  [facility]   Electronically Signed   By: Malachy Moan M.D.   On: 02/17/2015 15:00    Labs:  CBC:  Recent Labs  02/16/15 0055 02/16/15 0715 02/17/15 0500 02/18/15 0527  WBC 12.3* 11.8* 9.4 5.4  HGB 11.1* 9.6* 9.3* 9.6*  HCT 32.6* 28.8* 28.2* 28.7*  PLT 224 221 207 228    COAGS:  Recent Labs  02/16/15 0915  INR 1.12  APTT 30    BMP:  Recent Labs  02/16/15 0055 02/16/15 0715 02/17/15 0500 02/18/15 0527  NA 133* 137 136 138  K 3.2* 3.4* 4.0 4.1  CL 103 105 104 105  CO2 GLUCOSE 134* 104* 160* 123*  BUN 6 5* <5* <5*  CALCIUM 8.3* 8.0* 8.2* 8.6  CREATININE 0.61 0.69 0.58 0.58  GFRNONAA >90 >90 >90 >90  GFRAA >90 >90 >90 >90    LIVER FUNCTION TESTS: No results for input(s): BILITOT, AST, ALT, ALKPHOS, PROT, ALBUMIN in the last 8760 hours.  Assessment and Plan:  Appendiceal abscess  drain intact Will follow Plan per [facility] Will need 2-3 week follow up in IR drain clinic for CT and drain inj   Signed: Gaylene Moylan A 02/18/2015, 1:34 PM   I spent a total of 15 Minutes in face to face in clinical consultation/evaluation, greater than 50% of which was counseling/coordinating care for abscess drain

## 2015-02-19 LAB — CULTURE, ROUTINE-ABSCESS

## 2015-02-19 MED ORDER — HYDROCODONE-ACETAMINOPHEN 5-325 MG PO TABS: 1.0000 | ORAL_TABLET | ORAL | Status: DC | PRN

## 2015-02-19 MED ORDER — AMOXICILLIN-POT CLAVULANATE 875-125 MG PO TABS: 1.0000 | ORAL_TABLET | Freq: Two times a day (BID) | ORAL | Status: DC

## 2015-02-19 NOTE — Progress Notes (Signed)
Patient instructed on how to flush drain. Patient able to give correct return demonstration of flushing drain and able to verbalize steps. Patient able to change dressing on drain. Able to verbalize dressing change daily and to flush drain twice a day at home (per Dr Gerrit FriendsGerkin). Supplies given. Other discharge instructions discussed with patient until no further questions ask. Am assessment unchanged X iv removed. Pt for follow up CT scan on Thursday per Caryn BeeKevin PA for interventional radiology. Radiology to call her with appointment details

## 2015-02-19 NOTE — Progress Notes (Signed)
Subjective: Pt feeling ok. Tolerating some diet  Objective: Physical Exam: BP 84/42 mmHg  Pulse 70  Temp(Src) 98.2 F (36.8 C) (Oral)  Resp 16  Ht 5\' 1"  (1.549 m)  Wt 127 lb (57.607 kg)  BMI 24.01 kg/m2  SpO2 100%  LMP 01/14/2015 RLQ drain intact, thin but cloudy blood tinged fluid output. ~30cc per day   Labs: CBC  Recent Labs  02/17/15 0500 02/18/15 0527  WBC 9.4 5.4  HGB 9.3* 9.6*  HCT 28.2* 28.7*  PLT 207 228   BMET  Recent Labs  02/17/15 0500 02/18/15 0527  NA 136 138  K 4.0 4.1  CL 104 105  CO2 24 25  GLUCOSE 160* 123*  BUN <5* <5*  CREATININE 0.58 0.58  CALCIUM 8.2* 8.6     Studies/Results: Ct Image Guided Drainage By Percutaneous Catheter  02/17/2015   CLINICAL DATA:  49 year old female with acute appendicitis and periappendiceal abscess. Percutaneous drainage is warranted two temporize patient until interval appendectomy.  EXAM: CT IMAGE GUIDED DRAINAGE BY PERCUTANEOUS CATHETER  Date: 02/17/2015  PROCEDURE: 1. CT-guided drain placement Interventional Radiologist:  Sterling BigHeath K. McCullough, MD  ANESTHESIA/SEDATION: Moderate (conscious) sedation was used. 3.5 mg Versed, 25 mcg Fentanyl were administered intravenously. The patient's vital signs were monitored continuously by radiology nursing throughout the procedure.  Sedation Time: 19 minutes  MEDICATIONS: None additional  TECHNIQUE: Informed consent was obtained from the patient following explanation of the procedure, risks, benefits and alternatives. The patient understands, agrees and consents for the procedure. All questions were addressed. A time out was performed.  Patient was placed in the left lateral decubitus position. A planning axial CT scan was performed. The parity appendiceal fluid collection was identified. A suitable skin entry site was selected and marked. The region was sterilely prepped and draped in standard fashion using Betadine skin prep. Local anesthesia was attained by infiltration with  1% lidocaine. Under intermittent CT fluoroscopic guidance, an 18 gauge introducer needle was advanced into the fluid collection. Care was taken to remain posterior to the adjacent cecum. A wire was then coiled within the fluid collection and the tract dilated to 12 [place]. Ultimately, a Adriana SimasCook 12 [place] all-purpose drainage catheter was advanced over the wire or and formed within the fluid collection. Approximately 10 mL of frankly purulent thick material was aspirated. The material was sent for culture.  The tube was gently flushed and connected to JP bulb suction. The tube was then secured to the skin with 0 Prolene suture and an adhesive fixation device. The patient tolerated the procedure well.  COMPLICATIONS: None  IMPRESSION: 1. Successful placement of a 12 French drain into the periappendiceal abscess collection with aspiration of 10 mL frankly purulent, very thick material. Aspirated fluid was sent for culture. 2. Drain left to JP bulb suction.  PLAN: 1. Maintain drain to JP bulb suction. 2. Once drainage is < 15 mL/day for 2 days, recommend repeat CT scan of the abdomen/pelvis with contrast material as well as drain injection under fluoroscopy prior to removal to exclude residual undrained abscess and fistulous communication with the appendiceal or cecal lumens. Patient can be followed in the IR drain clinic in 2-3 weeks. Signed,  Sterling BigHeath K. McCullough, MD  [facility]  [facility]   Electronically Signed   By: Malachy MoanHeath  McCullough M.D.   On: 02/17/2015 15:00    Assessment/Plan: RLQ abscess s/p perc drain Plans per [facility] Rec f/u CT at 1 week interval or when drain output remains <  10cc per day.   LOS: 3 days    Brayton El PA-C 02/19/2015 9:38 AM

## 2015-02-19 NOTE — Progress Notes (Signed)
Patient ID: Taylor Murphy, female   DOB: 07-03-1966, 49 y.o.   MRN: [id number]  General Surgery [facility]  Subjective: Patient in bed, comfortable, smiling, no complaints.  Tolerating full liquid diet.  Objective: Vital signs in last 24 hours: Temp:  [97.7 F (36.5 C)-99.1 F (37.3 C)] 98.2 F (36.8 C) (02/20 0901) Pulse Rate:  [70-74] 70 (02/20 0901) Resp:  [16] 16 (02/20 0901) BP: (84-95)/(42-51) 84/42 mmHg (02/20 0901) SpO2:  [99 %-100 %] 100 % (02/20 0901) Last BM Date: 02/18/15  Intake/Output from previous day: 02/19 0701 - 02/20 0700 In: 2626.3 [P.O.:600; I.V.:2016.3] Out: 2829 [Urine:2800; Drains:29] Intake/Output this shift: Total I/O In: 380 [P.O.:380] Out: -   Physical Exam: HEENT - sclerae clear, mucous membranes moist Neck - soft Chest - clear bilaterally Cor - RRR Abdomen - soft, non-tender; drain RLQ with thin serosanguinous output Ext - no edema, non-tender Neuro - alert & oriented, no focal deficits  Lab Results:   Recent Labs  02/17/15 0500 02/18/15 0527  WBC 9.4 5.4  HGB 9.3* 9.6*  HCT 28.2* 28.7*  PLT 207 228   BMET  Recent Labs  02/17/15 0500 02/18/15 0527  NA 136 138  K 4.0 4.1  CL 104 105  CO2 24 25  GLUCOSE 160* 123*  BUN <5* <5*  CREATININE 0.58 0.58  CALCIUM 8.2* 8.6   PT/INR No results for input(s): LABPROT, INR in the last 72 hours. Comprehensive Metabolic Panel:    Component Value Date/Time   NA 138 02/18/2015 0527   NA 136 02/17/2015 0500   K 4.1 02/18/2015 0527   K 4.0 02/17/2015 0500   CL 105 02/18/2015 0527   CL 104 02/17/2015 0500   CO2 25 02/18/2015 0527   CO2 24 02/17/2015 0500   BUN <5* 02/18/2015 0527   BUN <5* 02/17/2015 0500   CREATININE 0.58 02/18/2015 0527   CREATININE 0.58 02/17/2015 0500   GLUCOSE 123* 02/18/2015 0527   GLUCOSE 160* 02/17/2015 0500   CALCIUM 8.6 02/18/2015 0527   CALCIUM 8.2* 02/17/2015 0500    Studies/Results: Ct Image Guided Drainage By Percutaneous  Catheter  02/17/2015   CLINICAL DATA:  49 year old female with acute appendicitis and periappendiceal abscess. Percutaneous drainage is warranted two temporize patient until interval appendectomy.  EXAM: CT IMAGE GUIDED DRAINAGE BY PERCUTANEOUS CATHETER  Date: 02/17/2015  PROCEDURE: 1. CT-guided drain placement Interventional Radiologist:  Sterling Big, MD  ANESTHESIA/SEDATION: Moderate (conscious) sedation was used. 3.5 mg Versed, 25 mcg Fentanyl were administered intravenously. The patient's vital signs were monitored continuously by radiology nursing throughout the procedure.  Sedation Time: 19 minutes  MEDICATIONS: None additional  TECHNIQUE: Informed consent was obtained from the patient following explanation of the procedure, risks, benefits and alternatives. The patient understands, agrees and consents for the procedure. All questions were addressed. A time out was performed.  Patient was placed in the left lateral decubitus position. A planning axial CT scan was performed. The parity appendiceal fluid collection was identified. A suitable skin entry site was selected and marked. The region was sterilely prepped and draped in standard fashion using Betadine skin prep. Local anesthesia was attained by infiltration with 1% lidocaine. Under intermittent CT fluoroscopic guidance, an 18 gauge introducer needle was advanced into the fluid collection. Care was taken to remain posterior to the adjacent cecum. A wire was then coiled within the fluid collection and the tract dilated to 12 [place]. Ultimately, a Adriana Simas 12 [place] all-purpose drainage catheter was advanced over the  wire or and formed within the fluid collection. Approximately 10 mL of frankly purulent thick material was aspirated. The material was sent for culture.  The tube was gently flushed and connected to JP bulb suction. The tube was then secured to the skin with 0 Prolene suture and an adhesive fixation device. The patient tolerated the  procedure well.  COMPLICATIONS: None  IMPRESSION: 1. Successful placement of a 12 French drain into the periappendiceal abscess collection with aspiration of 10 mL frankly purulent, very thick material. Aspirated fluid was sent for culture. 2. Drain left to JP bulb suction.  PLAN: 1. Maintain drain to JP bulb suction. 2. Once drainage is < 15 mL/day for 2 days, recommend repeat CT scan of the abdomen/pelvis with contrast material as well as drain injection under fluoroscopy prior to removal to exclude residual undrained abscess and fistulous communication with the appendiceal or cecal lumens. Patient can be followed in the IR drain clinic in 2-3 weeks. Signed,  Sterling BigHeath K. McCullough, MD  [facility]  [facility]   Electronically Signed   By: Malachy MoanHeath  McCullough M.D.   On: 02/17/2015 15:00    Anti-infectives: Anti-infectives    Start     Dose/Rate Route Frequency Ordered Stop   02/19/15 0000  amoxicillin-clavulanate (AUGMENTIN) 875-125 MG per tablet     1 tablet Oral 2 times daily 02/19/15 1010     02/16/15 0800  ertapenem (INVANZ) 1 g in sodium chloride 0.9 % 50 mL IVPB     1 g 100 mL/hr over 30 Minutes Intravenous Every 24 hours 02/16/15 0613     02/16/15 0045  vancomycin (VANCOCIN) IVPB 1000 mg/200 mL premix     1,000 mg 200 mL/hr over 60 Minutes Intravenous  Once 02/16/15 0042 02/16/15 0402   02/16/15 0045  piperacillin-tazobactam (ZOSYN) IVPB 3.375 g     3.375 g 100 mL/hr over 30 Minutes Intravenous  Once 02/16/15 0042 02/16/15 0144      Assessment & Plans: Perforated appendicitis with abscess  Plan to discharge home today with drain in place  Convert to oral Augmentin for 2 weeks  Will arrange drain clinic for repeat CT scan 02/24/2015  Will arrange follow up at [facility] office with Dr. Donell BeersByerly or Dr. Baltazar NajjarNewman  Kimie Pidcock M. Kourosh Jablonsky, MD, [facility] Office: [phone]   Dong Nimmons Judie PetitM 02/19/2015

## 2015-02-22 LAB — ANAEROBIC CULTURE

## 2015-02-24 ENCOUNTER — Other Ambulatory Visit (HOSPITAL_COMMUNITY): Payer: Self-pay | Admitting: Interventional Radiology

## 2015-02-24 DIAGNOSIS — L0291 Cutaneous abscess, unspecified: Secondary | ICD-10-CM
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
## 2015-03-01 ENCOUNTER — Ambulatory Visit (HOSPITAL_COMMUNITY)
Admission: RE | Admit: 2015-03-01 | Discharge: 2015-03-01 | Disposition: A | Discharge status: Home / Self Care | Payer: 59 | Source: Ambulatory Visit | Attending: Interventional Radiology | Admitting: Interventional Radiology

## 2015-03-01 ENCOUNTER — Encounter (HOSPITAL_COMMUNITY): Payer: Self-pay

## 2015-03-01 DIAGNOSIS — L0291 Cutaneous abscess, unspecified: Secondary | ICD-10-CM | POA: Insufficient documentation

## 2015-03-01 DIAGNOSIS — K651 Peritoneal abscess: Secondary | ICD-10-CM | POA: Insufficient documentation

## 2015-03-01 MED ORDER — IOHEXOL 300 MG/ML  SOLN
50.0000 mL | Freq: Once | INTRAMUSCULAR | Status: AC | PRN
  Administered 2015-03-01: 10 mL

## 2015-03-01 MED ORDER — IOHEXOL 300 MG/ML  SOLN
80.0000 mL | Freq: Once | INTRAMUSCULAR | Status: AC | PRN
  Administered 2015-03-01: 80 mL via INTRAVENOUS
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
## 2015-03-07 ENCOUNTER — Other Ambulatory Visit (INDEPENDENT_AMBULATORY_CARE_PROVIDER_SITE_OTHER): Payer: Self-pay | Admitting: General Surgery

## 2015-03-07 DIAGNOSIS — K3533 Acute appendicitis with perforation and localized peritonitis, with abscess: Secondary | ICD-10-CM

## 2015-03-30 ENCOUNTER — Other Ambulatory Visit: Payer: Self-pay | Admitting: *Deleted

## 2015-04-06 ENCOUNTER — Ambulatory Visit
Admission: RE | Admit: 2015-04-06 | Discharge: 2015-04-06 | Disposition: A | Discharge status: Home / Self Care | Payer: 59 | Source: Ambulatory Visit | Attending: General Surgery | Admitting: General Surgery

## 2015-04-06 DIAGNOSIS — K3533 Acute appendicitis with perforation and localized peritonitis, with abscess: Secondary | ICD-10-CM

## 2015-04-06 MED ORDER — IOPAMIDOL (ISOVUE-300) INJECTION 61%
100.0000 mL | Freq: Once | INTRAVENOUS | Status: AC | PRN
  Administered 2015-04-06: 100 mL via INTRAVENOUS

## 2015-04-07 NOTE — Progress Notes (Signed)
Quick Note:  Please let patient know that her CT still looks abnormal. I will need to see her back in clinic. tx FB ______

## 2015-04-18 ENCOUNTER — Other Ambulatory Visit: Payer: Self-pay | Admitting: General Surgery

## 2015-04-26 ENCOUNTER — Encounter (HOSPITAL_COMMUNITY)
Admission: RE | Admit: 2015-04-26 | Discharge: 2015-04-26 | Disposition: A | Discharge status: Home / Self Care | Payer: 59 | Source: Ambulatory Visit | Attending: General Surgery | Admitting: General Surgery

## 2015-04-26 ENCOUNTER — Encounter (HOSPITAL_COMMUNITY): Payer: Self-pay

## 2015-04-26 ENCOUNTER — Other Ambulatory Visit: Payer: Self-pay | Admitting: *Deleted

## 2015-04-26 DIAGNOSIS — I9589 Other hypotension: Secondary | ICD-10-CM | POA: Diagnosis not present

## 2015-04-26 DIAGNOSIS — K352 Acute appendicitis with generalized peritonitis: Secondary | ICD-10-CM | POA: Diagnosis not present

## 2015-04-26 DIAGNOSIS — K3580 Unspecified acute appendicitis: Secondary | ICD-10-CM | POA: Diagnosis present

## 2015-04-26 DIAGNOSIS — K219 Gastro-esophageal reflux disease without esophagitis: Secondary | ICD-10-CM | POA: Diagnosis not present

## 2015-04-26 HISTORY — DX: Gastro-esophageal reflux disease without esophagitis: K21.9

## 2015-04-26 LAB — CBC
HCT: 36.2 % (ref 36.0–46.0)
HEMOGLOBIN: 12.4 g/dL (ref 12.0–15.0)
MCH: 30.5 pg (ref 26.0–34.0)
MCHC: 34.3 g/dL (ref 30.0–36.0)
MCV: 88.9 fL (ref 78.0–100.0)
Platelets: 154 10*3/uL (ref 150–400)
RBC: 4.07 MIL/uL (ref 3.87–5.11)
RDW: 13.2 % (ref 11.5–15.5)
WBC: 5.4 10*3/uL (ref 4.0–10.5)

## 2015-04-26 LAB — HCG, SERUM, QUALITATIVE: Preg, Serum: NEGATIVE

## 2015-04-26 NOTE — Pre-Procedure Instructions (Signed)
Louan Kreuzer  04/26/2015   Your procedure is scheduled on:  Thursday, April 28, 2015  [facility]:30 AM.  Call this number if you have problems the morning of surgery: [phone]   Remember:   Do not eat food or drink liquids after midnight Wednesday, April 27, 2015   Take these medicines the morning of surgery with A SIP OF WATER: omeprazole (PRILOSEC),  pantoprazole (PROTONIX), if needed: pain medication  Stop taking Aspirin, vitamins and herbal medications. Do not take any NSAIDs ie: Ibuprofen, Advil, Naproxen or any medication containing Aspirin; stop now.   Do not wear jewelry, make-up or nail polish.  Do not wear lotions, powders, or perfumes. You may not wear deodorant.  Do not shave 48 hours prior to surgery.   Do not bring valuables to the hospital.  [facility] is not responsible for any belongings or valuables.               Contacts, dentures or bridgework may not be worn into surgery.  Leave suitcase in the car. After surgery it may be brought to your room.  For patients admitted to the hospital, discharge time is determined by your treatment team.               Patients discharged the day of surgery will not be allowed to drive home.  Name and phone number of your driver:-                 Special Instructions: [place] - Preparing for Surgery    Please read over the following fact sheets that you were given: Pain Booklet, Coughing and Deep Breathing and Surgical Site Infection Prevention

## 2015-04-26 NOTE — Progress Notes (Signed)
Called Dr.Byerly's office for orders.

## 2015-04-27 ENCOUNTER — Encounter (HOSPITAL_COMMUNITY): Payer: Self-pay | Admitting: General Surgery

## 2015-04-27 NOTE — H&P (Signed)
  Taylor BodoSarala Murphy 04/18/2015 12:11 PM Location: [facility] Patient #: [id number] DOB: 10/10/1966 Married / Language: English / Race: More than one race Female  History of Present Illness [name](Kalid Ghan MD; 04/18/2015 12:37 PM) The patient is a 49 year old female who presents with appendicitis. Patient is a 49 year old female who was seen in the hospital for perforated appendicitis. She got a percutaneous drain placed for abscess. The follow-up CT scan looks like the abscess was resolved and so the drain was removed. She has not had recurrent fevers, chills, or night sweats. She does have some occasional soreness in her suprapubic region but not specifically pain. She has not had any nausea or vomiting. Her energy level is okay.   Allergies [name](Bernie Morris, CMA; 04/18/2015 12:18 PM) No Known Drug Allergies03/07/2015  Medication History [name](Bernie Morris, CMA; 04/18/2015 12:18 PM) Medications Reconciled Tylenol Sinus Congestion/Pain (5-325-200MG  Tablet, Oral) Active. Hydrocodone-Homatropine (5-1.5MG  Tablet, Oral) Active. PriLOSEC (20MG  Capsule DR, Oral) Active. Protonix (40MG  Packet, Oral) Active.  Review of Systems [name](Ocean Schildt MD; 04/18/2015 12:37 PM) All other systems negative   Vitals [name](Bernie Morris CMA; 04/18/2015 12:19 PM) 04/18/2015 12:18 PM Weight: 125 lb Height: 62in Body Surface Area: 1.57 m Body Mass Index: 22.86 kg/m Temp.: 98.4F(Oral)  Pulse: 76 (Regular)  Resp.: 20 (Unlabored)  BP: 100/60 (Sitting, Left Arm, Standard)    Physical Exam [name](Lenward Able MD; 04/18/2015 12:38 PM) General Mental Status-Alert. General Appearance-Consistent with stated age. Hydration-Well hydrated. Voice-Normal.  Chest and Lung Exam Chest and lung exam reveals -quiet, even and easy respiratory effort with no use of accessory muscles. Inspection Chest Wall - Normal. Back - normal.  Abdomen Note: soft, non distended. Mild tenderness in the  RLQ.     Assessment & Plan [name](Chassity Ludke MD; 04/18/2015 12:36 PM) PERFORATED APPENDIX (540.0  K35.2) Impression: Repeat CT scan in 1 month to assess her appendiceal remnant. If there is evidence of a remnant, we will schedule her for laparoscopic appendectomy. If there is no evidence of appendiceal remnant, then I will have her follow-up as needed. SUBACUTE APPENDICITIS (542  K36) Impression: Although the patient has minimal soreness, she does have a recurrent fluid collection and distention of the appendiceal tip. Considering how sick she was, we will need to do an interval appendectomy. I do not think she needs another drain placed, but she does need to have this addressed.  I discussed the surgery with the patient. I reviewed the risks of surgery. I gave the patient educational material and reviewed anatomic diagrams.  I discussed bleeding, infection, damage to adjacent structures, possible need for additional procedures. Current Plans  Pt Education - Appendicitis: surgery Follow up in 2 weeks or as needed Schedule for Surgery   Signed by Almond LintFaera Jailey Booton, MD (04/18/2015 12:39 PM)

## 2015-04-28 ENCOUNTER — Ambulatory Visit (HOSPITAL_COMMUNITY)
Admission: RE | Admit: 2015-04-28 | Discharge: 2015-04-30 | Disposition: A | Discharge status: Home / Self Care | Payer: 59 | Source: Ambulatory Visit | Attending: General Surgery | Admitting: General Surgery

## 2015-04-28 ENCOUNTER — Encounter (HOSPITAL_COMMUNITY): Payer: Self-pay | Admitting: *Deleted

## 2015-04-28 ENCOUNTER — Inpatient Hospital Stay (HOSPITAL_COMMUNITY): Payer: 59 | Admitting: Certified Registered Nurse Anesthetist

## 2015-04-28 ENCOUNTER — Encounter (HOSPITAL_COMMUNITY)
Admission: RE | Disposition: A | Discharge status: Home / Self Care | Payer: Self-pay | Source: Ambulatory Visit | Attending: General Surgery

## 2015-04-28 DIAGNOSIS — K219 Gastro-esophageal reflux disease without esophagitis: Secondary | ICD-10-CM | POA: Insufficient documentation

## 2015-04-28 DIAGNOSIS — K352 Acute appendicitis with generalized peritonitis: Secondary | ICD-10-CM | POA: Diagnosis not present

## 2015-04-28 DIAGNOSIS — K36 Other appendicitis: Secondary | ICD-10-CM | POA: Diagnosis present

## 2015-04-28 DIAGNOSIS — Z9049 Acquired absence of other specified parts of digestive tract: Secondary | ICD-10-CM

## 2015-04-28 DIAGNOSIS — I9589 Other hypotension: Secondary | ICD-10-CM | POA: Insufficient documentation

## 2015-04-28 HISTORY — PX: LAPAROSCOPIC APPENDECTOMY: SHX408

## 2015-04-28 HISTORY — PX: APPENDECTOMY: SHX54

## 2015-04-28 LAB — CREATININE, SERUM: Creatinine, Ser: 0.78 mg/dL (ref 0.50–1.10)

## 2015-04-28 LAB — CBC
HEMATOCRIT: 32.4 % — AB (ref 36.0–46.0)
HEMOGLOBIN: 11.2 g/dL — AB (ref 12.0–15.0)
MCH: 30.4 pg (ref 26.0–34.0)
MCHC: 34.6 g/dL (ref 30.0–36.0)
MCV: 88 fL (ref 78.0–100.0)
Platelets: 135 10*3/uL — ABNORMAL LOW (ref 150–400)
RBC: 3.68 MIL/uL — AB (ref 3.87–5.11)
RDW: 13.3 % (ref 11.5–15.5)
WBC: 11.2 10*3/uL — ABNORMAL HIGH (ref 4.0–10.5)

## 2015-04-28 SURGERY — APPENDECTOMY, LAPAROSCOPIC
Anesthesia: General | Site: Abdomen

## 2015-04-28 MED ORDER — MORPHINE SULFATE 2 MG/ML IJ SOLN: 1.0000 mg | INTRAMUSCULAR | Status: DC | PRN

## 2015-04-28 MED ORDER — ONDANSETRON HCL 4 MG/2ML IJ SOLN
INTRAMUSCULAR | Status: DC | PRN
  Administered 2015-04-28: 4 mg via INTRAVENOUS

## 2015-04-28 MED ORDER — METRONIDAZOLE IN NACL 5-0.79 MG/ML-% IV SOLN
500.0000 mg | Freq: Three times a day (TID) | INTRAVENOUS | Status: AC
  Administered 2015-04-28 (×2): 500 mg via INTRAVENOUS
  Filled 2015-04-28 (×2): qty 100

## 2015-04-28 MED ORDER — LIDOCAINE HCL (CARDIAC) 20 MG/ML IV SOLN
INTRAVENOUS | Status: AC
  Filled 2015-04-28: qty 5

## 2015-04-28 MED ORDER — PHENYLEPHRINE HCL 10 MG/ML IJ SOLN
INTRAMUSCULAR | Status: DC | PRN
  Administered 2015-04-28 (×4): 40 ug via INTRAVENOUS

## 2015-04-28 MED ORDER — ONDANSETRON HCL 4 MG PO TABS: 4.0000 mg | ORAL_TABLET | Freq: Four times a day (QID) | ORAL | Status: DC | PRN

## 2015-04-28 MED ORDER — SODIUM CHLORIDE 0.9 % IJ SOLN
INTRAMUSCULAR | Status: AC
  Filled 2015-04-28: qty 10

## 2015-04-28 MED ORDER — NEOSTIGMINE METHYLSULFATE 10 MG/10ML IV SOLN
INTRAVENOUS | Status: DC | PRN
  Administered 2015-04-28: 3 mg via INTRAVENOUS

## 2015-04-28 MED ORDER — OXYCODONE HCL 5 MG PO TABS
5.0000 mg | ORAL_TABLET | Freq: Once | ORAL | Status: DC | PRN
Start: 2015-04-28 — End: 2015-04-28

## 2015-04-28 MED ORDER — ROCURONIUM BROMIDE 100 MG/10ML IV SOLN
INTRAVENOUS | Status: DC | PRN
  Administered 2015-04-28: 30 mg via INTRAVENOUS
  Administered 2015-04-28: 10 mg via INTRAVENOUS

## 2015-04-28 MED ORDER — GLYCOPYRROLATE 0.2 MG/ML IJ SOLN
INTRAMUSCULAR | Status: DC | PRN
  Administered 2015-04-28: 0.4 mg via INTRAVENOUS

## 2015-04-28 MED ORDER — LIDOCAINE HCL (CARDIAC) 20 MG/ML IV SOLN
INTRAVENOUS | Status: DC | PRN
  Administered 2015-04-28: 60 mg via INTRAVENOUS
  Administered 2015-04-28: 40 mg via INTRAVENOUS

## 2015-04-28 MED ORDER — LACTATED RINGERS IV SOLN
INTRAVENOUS | Status: DC | PRN
  Administered 2015-04-28: 07:00:00 via INTRAVENOUS

## 2015-04-28 MED ORDER — MIDAZOLAM HCL 5 MG/5ML IJ SOLN
INTRAMUSCULAR | Status: DC | PRN
  Administered 2015-04-28: 1 mg via INTRAVENOUS

## 2015-04-28 MED ORDER — ENOXAPARIN SODIUM 40 MG/0.4ML ~~LOC~~ SOLN
40.0000 mg | SUBCUTANEOUS | Status: DC
  Administered 2015-04-29 – 2015-04-30 (×2): 40 mg via SUBCUTANEOUS
  Filled 2015-04-28 (×2): qty 0.4

## 2015-04-28 MED ORDER — ONDANSETRON HCL 4 MG/2ML IJ SOLN: 4.0000 mg | Freq: Four times a day (QID) | INTRAMUSCULAR | Status: DC | PRN

## 2015-04-28 MED ORDER — ACETAMINOPHEN 500 MG PO TABS
1000.0000 mg | ORAL_TABLET | Freq: Four times a day (QID) | ORAL | Status: AC
  Administered 2015-04-28 – 2015-04-29 (×4): 1000 mg via ORAL
  Filled 2015-04-28 (×4): qty 2

## 2015-04-28 MED ORDER — SUCCINYLCHOLINE CHLORIDE 20 MG/ML IJ SOLN
INTRAMUSCULAR | Status: AC
  Filled 2015-04-28: qty 1

## 2015-04-28 MED ORDER — KCL IN DEXTROSE-NACL 20-5-0.45 MEQ/L-%-% IV SOLN
INTRAVENOUS | Status: DC
  Administered 2015-04-28 – 2015-04-30 (×4): via INTRAVENOUS
  Filled 2015-04-28 (×7): qty 1000

## 2015-04-28 MED ORDER — EPHEDRINE SULFATE 50 MG/ML IJ SOLN
INTRAMUSCULAR | Status: DC | PRN
  Administered 2015-04-28: 10 mg via INTRAVENOUS

## 2015-04-28 MED ORDER — 0.9 % SODIUM CHLORIDE (POUR BTL) OPTIME
TOPICAL | Status: DC | PRN
  Administered 2015-04-28: 1000 mL

## 2015-04-28 MED ORDER — HYDROMORPHONE HCL 1 MG/ML IJ SOLN
0.2500 mg | INTRAMUSCULAR | Status: DC | PRN
  Administered 2015-04-28: 0.5 mg via INTRAVENOUS

## 2015-04-28 MED ORDER — OXYCODONE HCL 5 MG/5ML PO SOLN: 5.0000 mg | Freq: Once | ORAL | Status: DC | PRN

## 2015-04-28 MED ORDER — KETOROLAC TROMETHAMINE 15 MG/ML IJ SOLN: 15.0000 mg | Freq: Four times a day (QID) | INTRAMUSCULAR | Status: DC | PRN

## 2015-04-28 MED ORDER — FENTANYL CITRATE (PF) 250 MCG/5ML IJ SOLN
INTRAMUSCULAR | Status: AC
  Filled 2015-04-28: qty 5

## 2015-04-28 MED ORDER — NEOSTIGMINE METHYLSULFATE 10 MG/10ML IV SOLN
INTRAVENOUS | Status: AC
  Filled 2015-04-28: qty 1

## 2015-04-28 MED ORDER — ARTIFICIAL TEARS OP OINT
TOPICAL_OINTMENT | OPHTHALMIC | Status: AC
  Filled 2015-04-28: qty 3.5

## 2015-04-28 MED ORDER — BUPIVACAINE-EPINEPHRINE (PF) 0.25% -1:200000 IJ SOLN
INTRAMUSCULAR | Status: AC
  Filled 2015-04-28: qty 30

## 2015-04-28 MED ORDER — LIDOCAINE HCL (PF) 1 % IJ SOLN
INTRAMUSCULAR | Status: AC
Start: 2015-04-28 — End: 2015-04-28
  Filled 2015-04-28: qty 30

## 2015-04-28 MED ORDER — SODIUM CHLORIDE 0.9 % IV BOLUS (SEPSIS)
500.0000 mL | Freq: Once | INTRAVENOUS | Status: AC
  Administered 2015-04-28: 500 mL via INTRAVENOUS

## 2015-04-28 MED ORDER — ROCURONIUM BROMIDE 50 MG/5ML IV SOLN
INTRAVENOUS | Status: AC
  Filled 2015-04-28: qty 1

## 2015-04-28 MED ORDER — ONDANSETRON HCL 4 MG/2ML IJ SOLN
INTRAMUSCULAR | Status: AC
  Filled 2015-04-28: qty 2

## 2015-04-28 MED ORDER — EPHEDRINE SULFATE 50 MG/ML IJ SOLN
INTRAMUSCULAR | Status: AC
  Filled 2015-04-28: qty 1

## 2015-04-28 MED ORDER — SODIUM CHLORIDE 0.9 % IR SOLN
Status: DC | PRN
  Administered 2015-04-28: 1000 mL

## 2015-04-28 MED ORDER — PROMETHAZINE HCL 25 MG/ML IJ SOLN
6.2500 mg | INTRAMUSCULAR | Status: DC | PRN
  Administered 2015-04-28: 6.25 mg via INTRAVENOUS

## 2015-04-28 MED ORDER — DEXTROSE 5 % IV SOLN
1.0000 g | Freq: Once | INTRAVENOUS | Status: AC
  Administered 2015-04-28: 1 g via INTRAVENOUS
  Filled 2015-04-28: qty 1

## 2015-04-28 MED ORDER — KCL IN DEXTROSE-NACL 20-5-0.45 MEQ/L-%-% IV SOLN
INTRAVENOUS | Status: AC
Start: 2015-04-28 — End: 2015-04-28
  Administered 2015-04-28: 1000 mL
  Filled 2015-04-28: qty 1000

## 2015-04-28 MED ORDER — FENTANYL CITRATE (PF) 100 MCG/2ML IJ SOLN
INTRAMUSCULAR | Status: DC | PRN
  Administered 2015-04-28 (×4): 50 ug via INTRAVENOUS

## 2015-04-28 MED ORDER — CIPROFLOXACIN IN D5W 400 MG/200ML IV SOLN
400.0000 mg | Freq: Two times a day (BID) | INTRAVENOUS | Status: AC
  Administered 2015-04-28: 400 mg via INTRAVENOUS
  Filled 2015-04-28: qty 200

## 2015-04-28 MED ORDER — KETOROLAC TROMETHAMINE 15 MG/ML IJ SOLN
15.0000 mg | Freq: Four times a day (QID) | INTRAMUSCULAR | Status: AC
  Administered 2015-04-28 – 2015-04-29 (×3): 15 mg via INTRAVENOUS
  Filled 2015-04-28 (×3): qty 1

## 2015-04-28 MED ORDER — ARTIFICIAL TEARS OP OINT
TOPICAL_OINTMENT | OPHTHALMIC | Status: DC | PRN
  Administered 2015-04-28: 1 via OPHTHALMIC

## 2015-04-28 MED ORDER — PROPOFOL 10 MG/ML IV BOLUS
INTRAVENOUS | Status: AC
  Filled 2015-04-28: qty 20

## 2015-04-28 MED ORDER — PROPOFOL 10 MG/ML IV BOLUS
INTRAVENOUS | Status: DC | PRN
  Administered 2015-04-28: 130 mg via INTRAVENOUS

## 2015-04-28 MED ORDER — LIDOCAINE HCL 1 % IJ SOLN
INTRAMUSCULAR | Status: DC | PRN
  Administered 2015-04-28: 60 mL via INTRAMUSCULAR

## 2015-04-28 MED ORDER — GLYCOPYRROLATE 0.2 MG/ML IJ SOLN
INTRAMUSCULAR | Status: AC
  Filled 2015-04-28: qty 3

## 2015-04-28 MED ORDER — HYDROMORPHONE HCL 1 MG/ML IJ SOLN
INTRAMUSCULAR | Status: AC
  Filled 2015-04-28: qty 1

## 2015-04-28 MED ORDER — OXYCODONE HCL 5 MG PO TABS
ORAL_TABLET | ORAL | Status: AC
  Filled 2015-04-28: qty 1

## 2015-04-28 MED ORDER — PROMETHAZINE HCL 25 MG/ML IJ SOLN
INTRAMUSCULAR | Status: AC
  Filled 2015-04-28: qty 1

## 2015-04-28 MED ORDER — MIDAZOLAM HCL 2 MG/2ML IJ SOLN
INTRAMUSCULAR | Status: AC
  Filled 2015-04-28: qty 2

## 2015-04-28 MED ORDER — OXYCODONE-ACETAMINOPHEN 5-325 MG PO TABS: 1.0000 | ORAL_TABLET | ORAL | Status: DC | PRN

## 2015-04-28 SURGICAL SUPPLY — 40 items
APPLIER CLIP ROT 10 11.4 M/L (STAPLE)
CANISTER SUCTION 2500CC (MISCELLANEOUS) ×3 IMPLANT
CHLORAPREP W/TINT 26ML (MISCELLANEOUS) ×3 IMPLANT
CLIP APPLIE ROT 10 11.4 M/L (STAPLE) IMPLANT
COVER SURGICAL LIGHT HANDLE (MISCELLANEOUS) ×3 IMPLANT
CUTTER FLEX LINEAR 45M (STAPLE) ×3 IMPLANT
DRAPE LAPAROSCOPIC ABDOMINAL (DRAPES) ×3 IMPLANT
DRAPE WARM FLUID 44X44 (DRAPE) ×3 IMPLANT
ELECT REM PT RETURN 9FT ADLT (ELECTROSURGICAL) ×3
ELECTRODE REM PT RTRN 9FT ADLT (ELECTROSURGICAL) ×1 IMPLANT
ENDOLOOP SUT PDS II  0 18 (SUTURE)
ENDOLOOP SUT PDS II 0 18 (SUTURE) IMPLANT
GLOVE BIO SURGEON STRL SZ 6 (GLOVE) ×3 IMPLANT
GLOVE BIO SURGEON STRL SZ7 (GLOVE) ×3 IMPLANT
GLOVE BIOGEL PI IND STRL 6.5 (GLOVE) ×1 IMPLANT
GLOVE BIOGEL PI IND STRL 7.0 (GLOVE) ×1 IMPLANT
GLOVE BIOGEL PI INDICATOR 6.5 (GLOVE) ×2
GLOVE BIOGEL PI INDICATOR 7.0 (GLOVE) ×2
GOWN STRL REUS W/ TWL LRG LVL3 (GOWN DISPOSABLE) ×2 IMPLANT
GOWN STRL REUS W/TWL 2XL LVL3 (GOWN DISPOSABLE) ×3 IMPLANT
GOWN STRL REUS W/TWL LRG LVL3 (GOWN DISPOSABLE) ×4
KIT BASIN OR (CUSTOM PROCEDURE TRAY) ×3 IMPLANT
KIT ROOM TURNOVER OR (KITS) ×3 IMPLANT
LIQUID BAND (GAUZE/BANDAGES/DRESSINGS) ×3 IMPLANT
NS IRRIG 1000ML POUR BTL (IV SOLUTION) ×3 IMPLANT
PAD ARMBOARD 7.5X6 YLW CONV (MISCELLANEOUS) ×6 IMPLANT
POUCH SPECIMEN RETRIEVAL 10MM (ENDOMECHANICALS) ×3 IMPLANT
RELOAD STAPLE TA45 3.5 REG BLU (ENDOMECHANICALS) ×12 IMPLANT
SCALPEL HARMONIC ACE (MISCELLANEOUS) ×3 IMPLANT
SET IRRIG TUBING LAPAROSCOPIC (IRRIGATION / IRRIGATOR) ×3 IMPLANT
SLEEVE ENDOPATH XCEL 5M (ENDOMECHANICALS) ×3 IMPLANT
SPECIMEN JAR SMALL (MISCELLANEOUS) ×3 IMPLANT
SUT MNCRL AB 4-0 PS2 18 (SUTURE) ×3 IMPLANT
TOWEL OR 17X24 6PK STRL BLUE (TOWEL DISPOSABLE) ×3 IMPLANT
TOWEL OR 17X26 10 PK STRL BLUE (TOWEL DISPOSABLE) ×3 IMPLANT
TRAY FOLEY CATH SILVER 16FR (SET/KITS/TRAYS/PACK) ×3 IMPLANT
TRAY LAPAROSCOPIC (CUSTOM PROCEDURE TRAY) ×3 IMPLANT
TROCAR XCEL BLUNT TIP 100MML (ENDOMECHANICALS) ×3 IMPLANT
TROCAR XCEL NON-BLD 5MMX100MML (ENDOMECHANICALS) ×3 IMPLANT
TUBING INSUFFLATION (TUBING) ×3 IMPLANT

## 2015-04-28 NOTE — Op Note (Signed)
Interval Appendectomy, Lap, Procedure Note  Indications: The patient presented with a history of right-sided abdominal pain. A CT revealed findings consistent with acute appendicitis with abscess.  She had a drain, and abscess resolved.  Drain was pulled.  She had follow up CT demonstrating recurrent fluid collection.  Pre-operative Diagnosis: sub acute appendicitis with fluid collection.    Post-operative Diagnosis: Same with mucocele  Surgeon: [name]   Assistants: Ariel Hilsinger, PA-S Myrtie Soman, RNFA  Anesthesia: General endotracheal anesthesia and Local anesthesia 1% plain lidocaine, 0.25.% bupivacaine  ASA Class: 1  Procedure Details  The patient was seen again in the Holding Room. The risks, benefits, complications, treatment options, and expected outcomes were discussed with the patient and/or family. The possibilities of perforation of viscus, bleeding, recurrent infection, the need for additional procedures, failure to diagnose a condition, and creating a complication requiring transfusion or operation were discussed. There was concurrence with the proposed plan and informed consent was obtained. The site of surgery was properly noted. The patient was taken to Operating Room, identified as [facility] and the procedure verified as Appendectomy. A Time Out was held and the above information confirmed.  The patient was placed in the supine position and general anesthesia was induced, along with placement of orogastric tube, Venodyne boots, and a Foley catheter. The abdomen was prepped and draped in a sterile fashion. Local anesthetic was infiltrated in the infraumbilical region.  A 1.5 cm curvilinear transverse incision was made just below the umbilicus.  The Kelly clamp was used to spread the subcutaneous tissues.  The fascia was elevated with 2 Kocher clamps and incised with the #11 blade.  A Tresa Endo was used to confirm entrance into the peritoneal cavity.  A pursestring  suture was placed around the fascial incision.  The Hasson trocar was inserted into the abdomen and held in place with the tails of the suture.  The pneumoperitoneum was then established to steady pressure of 15 mmHg.     Additional 5 mm cannulas then placed in the left lower quadrant of the abdomen and the suprapubic region under direct visualization.  A careful evaluation of the entire abdomen was carried out. The patient was placed in Trendelenburg and rotated to the left.  The small intestines were retracted in the cephalad and left lateral direction away from the pelvis and right lower quadrant. The patient was found to have an enlarged and inflamed appendix that was extending into the pelvis. There was no evidence of perforation.  The appendix was carefully dissected. The appendix was was skeletonized with the harmonic scalpel.   The appendix was divided at its base using an endo-GIA stapler. Minimal appendiceal stump was left in place. The appendix was removed from the abdomen with an Endocatch bag through the left subcostal port.  There was no evidence of bleeding, leakage, or complication after division of the appendix. Irrigation was also performed and irrigate suctioned from the abdomen as well.  The 5 mm trocars were removed.  The pneumoperitoneum was evacuated from the abdomen.    The trocar site skin wounds were closed with 4-0 Monocryl and dressed with Dermabond.  Instrument, sponge, and needle counts were correct at the conclusion of the case.   Findings: The appendix was found to be chronically inflamed. There were not signs of necrosis.  There was perforation. There was not abscess formation, there was mucocele.  Estimated Blood Loss:  Minimal         Drains: none  Specimens: appendix and portion of cecum to pathology         Complications:  None; patient tolerated the procedure well.         Disposition: PACU - hemodynamically stable.         Condition:  stable

## 2015-04-28 NOTE — Interval H&P Note (Signed)
History and Physical Interval Note:  04/28/2015 7:24 AM  Taylor Murphy  has presented today for surgery, with the diagnosis of APPENDICITIS  The various methods of treatment have been discussed with the patient and family. After consideration of risks, benefits and other options for treatment, the patient has consented to  Procedure(s): APPENDECTOMY LAPAROSCOPIC (N/A) as a surgical intervention .  The patient's history has been reviewed, patient examined, no change in status, stable for surgery.  I have reviewed the patient's chart and labs.  Questions were answered to the patient's satisfaction.     Shawnna Pancake

## 2015-04-28 NOTE — Transfer of Care (Signed)
Immediate Anesthesia Transfer of Care Note  Patient: Taylor Murphy  Procedure(s) Performed: Procedure(s): APPENDECTOMY LAPAROSCOPIC (N/A)  Patient Location: PACU  Anesthesia Type:General  Level of Consciousness: awake, alert , oriented and sedated  Airway & Oxygen Therapy: Patient Spontanous Breathing and Patient connected to nasal cannula oxygen  Post-op Assessment: Report given to RN, Post -op Vital signs reviewed and stable and Patient moving all extremities  Post vital signs: Reviewed and stable  Last Vitals:  Filed Vitals:   04/28/15 0650  BP: 126/68  Pulse: 77  Temp: 36.8 C  Resp: 18    Complications: No apparent anesthesia complications

## 2015-04-28 NOTE — Anesthesia Preprocedure Evaluation (Signed)
Anesthesia Evaluation  Patient identified by MRN, date of birth, ID band Patient awake    Reviewed: Allergy & Precautions, Patient's Chart, lab work & pertinent test results  History of Anesthesia Complications Negative for: history of anesthetic complications  Airway Mallampati: I   Neck ROM: Full    Dental   Pulmonary neg pulmonary ROS,  breath sounds clear to auscultation        Cardiovascular negative cardio ROS  Rhythm:Regular Rate:Normal     Neuro/Psych    GI/Hepatic Neg liver ROS, GERD-  ,  Endo/Other  negative endocrine ROS  Renal/GU negative Renal ROS     Musculoskeletal   Abdominal   Peds  Hematology negative hematology ROS (+)   Anesthesia Other Findings   Reproductive/Obstetrics                             Anesthesia Physical Anesthesia Plan  ASA: I  Anesthesia Plan: General   Post-op Pain Management:    Induction: Intravenous  Airway Management Planned: Oral ETT  Additional Equipment:   Intra-op Plan:   Post-operative Plan: Extubation in OR  Informed Consent: I have reviewed the patients History and Physical, chart, labs and discussed the procedure including the risks, benefits and alternatives for the proposed anesthesia with the patient or authorized representative who has indicated his/her understanding and acceptance.   Dental advisory given  Plan Discussed with: CRNA and Surgeon  Anesthesia Plan Comments:         Anesthesia Quick Evaluation

## 2015-04-28 NOTE — Anesthesia Postprocedure Evaluation (Signed)
  Anesthesia Post-op Note  Patient: Taylor Murphy  Procedure(s) Performed: Procedure(s): APPENDECTOMY LAPAROSCOPIC (N/A)  Patient Location: PACU  Anesthesia Type:General  Level of Consciousness: awake, alert  and sedated  Airway and Oxygen Therapy: Patient Spontanous Breathing  Post-op Pain: mild  Post-op Assessment: Post-op Vital signs reviewed  Post-op Vital Signs: stable  Last Vitals:  Filed Vitals:   04/28/15 1015  BP: 102/53  Pulse: 85  Temp:   Resp: 14    Complications: No apparent anesthesia complications

## 2015-04-28 NOTE — Anesthesia Procedure Notes (Addendum)
Procedure Name: Intubation Date/Time: 04/28/2015 7:38 AM Performed by: Fransisca KaufmannMEYER, Vincenta Steffey E Pre-anesthesia Checklist: Patient identified, Emergency Drugs available, Suction available, Patient being monitored and Timeout performed Patient Re-evaluated:Patient Re-evaluated prior to inductionOxygen Delivery Method: Circle system utilized Preoxygenation: Pre-oxygenation with 100% oxygen Intubation Type: IV induction Ventilation: Mask ventilation without difficulty Laryngoscope Size: Miller and 2 Grade View: Grade I Tube type: Oral Number of attempts: 1 Airway Equipment and Method: Stylet Placement Confirmation: ETT inserted through vocal cords under direct vision,  positive ETCO2 and breath sounds checked- equal and bilateral Secured at: 21 cm Tube secured with: Tape Dental Injury: Teeth and Oropharynx as per pre-operative assessment

## 2015-04-28 NOTE — Progress Notes (Signed)
Unable to remove piercing from left nare, tape applied over area, Dr. Jacklynn BueMassagee aware.

## 2015-04-28 NOTE — Discharge Instructions (Signed)
[facility]      [facility] °[phone] ° °ABDOMINAL SURGERY: POST OP INSTRUCTIONS ° °Always review your discharge instruction sheet given to you by the facility where your surgery was performed. ° °IF YOU HAVE DISABILITY OR FAMILY LEAVE FORMS, YOU MUST BRING THEM TO THE OFFICE FOR PROCESSING.  PLEASE DO NOT GIVE THEM TO YOUR DOCTOR. ° °1. A prescription for pain medication may be given to you upon discharge.  Take your pain medication as prescribed, if needed.  If narcotic pain medicine is not needed, then you may take acetaminophen (Tylenol) or ibuprofen (Advil) as needed. °2. Take your usually prescribed medications unless otherwise directed. °3. If you need a refill on your pain medication, please contact your pharmacy. They will contact our office to request authorization.  Prescriptions will not be filled after 5pm or on week-ends. °4. You should follow a light diet the first few days after arrival home, such as soup and crackers, pudding, etc.unless your doctor has advised otherwise. A high-fiber, low fat diet can be resumed as tolerated.   Be sure to include lots of fluids daily. Most patients will experience some swelling and bruising on the chest and neck area.  Ice packs will help.  Swelling and bruising can take several days to resolve °5. Most patients will experience some swelling and bruising in the area of the incision. Ice pack will help. Swelling and bruising can take several days to resolve..  °6. It is common to experience some constipation if taking pain medication after surgery.  Increasing fluid intake and taking a stool softener will usually help or prevent this problem from occurring.  A mild laxative (Milk of Magnesia or Miralax) should be taken according to package directions if there are no bowel movements after 48 hours. °7.  You may have steri-strips (small skin tapes) in place directly over the incision.  These strips should be left on the skin for 10-14 days.  If your  surgeon used skin glue on the incision, you may shower in 48 hours.  The glue will flake off over the next 2-3 weeks.  Any sutures or staples will be removed at the office during your follow-up visit. You may find that a light gauze bandage over your incision may keep your staples from being rubbed or pulled. You may shower and replace the bandage daily. °8. ACTIVITIES:  You may resume regular (light) daily activities beginning the next day--such as daily self-care, walking, climbing stairs--gradually increasing activities as tolerated.  You may have sexual intercourse when it is comfortable.  Refrain from any heavy lifting or straining until approved by your doctor. °a. You may drive when you no longer are taking prescription pain medication, you can comfortably wear a seatbelt, and you can safely maneuver your car and apply brakes °b. Return to Work: __________8 weeks if applicable_________________________ °9. You should see your doctor in the office for a follow-up appointment approximately two weeks after your surgery.  Make sure that you call for this appointment within a day or two after you arrive home to insure a convenient appointment time. °OTHER INSTRUCTIONS:  °_____________________________________________________________ °_____________________________________________________________ ° °WHEN TO CALL YOUR DOCTOR: °1. Fever over 101.0 °2. Inability to urinate °3. Nausea and/or vomiting °4. Extreme swelling or bruising °5. Continued bleeding from incision. °6. Increased pain, redness, or drainage from the incision. °7. Difficulty swallowing or breathing °8. Muscle cramping or spasms. °9. Numbness or tingling in hands or feet or around lips. ° °The clinic staff is   available to answer your questions during regular business hours.  Please don’t hesitate to call and ask to speak to one of the nurses if you have concerns. ° °For further questions, please visit [email or web address] ° ° ° °

## 2015-04-29 ENCOUNTER — Encounter (HOSPITAL_COMMUNITY): Payer: Self-pay | Admitting: General Surgery

## 2015-04-29 DIAGNOSIS — K352 Acute appendicitis with generalized peritonitis: Secondary | ICD-10-CM | POA: Diagnosis not present

## 2015-04-29 LAB — BASIC METABOLIC PANEL
Anion gap: 6 (ref 5–15)
BUN: 6 mg/dL (ref 6–23)
CO2: 26 mmol/L (ref 19–32)
Calcium: 8 mg/dL — ABNORMAL LOW (ref 8.4–10.5)
Chloride: 107 mmol/L (ref 96–112)
Creatinine, Ser: 0.81 mg/dL (ref 0.50–1.10)
GFR calc Af Amer: >90 mL/min (ref >90)
GFR, EST NON AFRICAN AMERICAN: 85 mL/min — AB (ref >90)
GLUCOSE: 135 mg/dL — AB (ref 70–99)
POTASSIUM: 3.3 mmol/L — AB (ref 3.5–5.1)
Sodium: 139 mmol/L (ref 135–145)

## 2015-04-29 LAB — CBC
HCT: 28.7 % — ABNORMAL LOW (ref 36.0–46.0)
HEMOGLOBIN: 9.7 g/dL — AB (ref 12.0–15.0)
MCH: 29.9 pg (ref 26.0–34.0)
MCHC: 33.8 g/dL (ref 30.0–36.0)
MCV: 88.6 fL (ref 78.0–100.0)
PLATELETS: 127 10*3/uL — AB (ref 150–400)
RBC: 3.24 MIL/uL — ABNORMAL LOW (ref 3.87–5.11)
RDW: 13.3 % (ref 11.5–15.5)
WBC: 8.1 10*3/uL (ref 4.0–10.5)

## 2015-04-29 MED ORDER — LACTATED RINGERS IV BOLUS (SEPSIS)
1000.0000 mL | Freq: Once | INTRAVENOUS | Status: AC
  Administered 2015-04-29: 1000 mL via INTRAVENOUS

## 2015-04-29 MED ORDER — SODIUM CHLORIDE 0.9 % IV BOLUS (SEPSIS)
1000.0000 mL | Freq: Once | INTRAVENOUS | Status: AC
  Administered 2015-04-29: 1000 mL via INTRAVENOUS

## 2015-04-29 MED ORDER — POTASSIUM CHLORIDE 10 MEQ/100ML IV SOLN
10.0000 meq | INTRAVENOUS | Status: DC
  Administered 2015-04-29: 10 meq via INTRAVENOUS
  Filled 2015-04-29 (×2): qty 100

## 2015-04-29 MED ORDER — POTASSIUM CHLORIDE CRYS ER 20 MEQ PO TBCR
40.0000 meq | EXTENDED_RELEASE_TABLET | Freq: Once | ORAL | Status: AC
  Administered 2015-04-29: 40 meq via ORAL
  Filled 2015-04-29: qty 2

## 2015-04-29 MED ORDER — DOCUSATE SODIUM 100 MG PO CAPS
100.0000 mg | ORAL_CAPSULE | Freq: Two times a day (BID) | ORAL | Status: DC | PRN
  Administered 2015-04-29: 100 mg via ORAL
  Filled 2015-04-29: qty 1

## 2015-04-29 MED ORDER — HYDROCODONE-ACETAMINOPHEN 5-325 MG PO TABS: 1.0000 | ORAL_TABLET | ORAL | Status: AC | PRN

## 2015-04-29 NOTE — Progress Notes (Signed)
1 Day Post-Op  Subjective: Doing well, but was hypotensive last night to 66/41.  No n/v.    Objective: Vital signs in last 24 hours: Temp:  [97.1 F (36.2 C)-98.5 F (36.9 C)] 98.5 F (36.9 C) (04/29 0600) Pulse Rate:  [77-120] 96 (04/29 0600) Resp:  [12-22] 20 (04/29 0600) BP: (66-121)/(41-76) 83/52 mmHg (04/29 0600) SpO2:  [98 %-100 %] 98 % (04/29 0600) Last BM Date: 04/27/15  Intake/Output from previous day: 04/28 0701 - 04/29 0700 In: 3398.3 [P.O.:50; I.V.:3348.3] Out: 1250 [Urine:1250] Intake/Output this shift:    General appearance: alert, cooperative and no distress Resp: breathing comfortably GI: soft, approp tender. Extremities: extremities normal, atraumatic, no cyanosis or edema  Lab Results:   Recent Labs  04/28/15 1814 04/29/15 0328  WBC 11.2* 8.1  HGB 11.2* 9.7*  HCT 32.4* 28.7*  PLT 135* 127*   BMET  Recent Labs  04/28/15 1814 04/29/15 0328  NA  --  139  K  --  3.3*  CL  --  107  CO2  --  26  GLUCOSE  --  135*  BUN  --  6  CREATININE 0.78 0.81  CALCIUM  --  8.0*   PT/INR No results for input(s): LABPROT, INR in the last 72 hours. ABG No results for input(s): PHART, HCO3 in the last 72 hours.  Invalid input(s): PCO2, PO2  Studies/Results: No results found.  Anti-infectives: Anti-infectives    Start     Dose/Rate Route Frequency Ordered Stop   04/28/15 1600  metroNIDAZOLE (FLAGYL) IVPB 500 mg     500 mg 100 mL/hr over 60 Minutes Intravenous Every 8 hours 04/28/15 1509 04/29/15 0050   04/28/15 1600  ciprofloxacin (CIPRO) IVPB 400 mg     400 mg 200 mL/hr over 60 Minutes Intravenous Every 12 hours 04/28/15 1509 04/28/15 1825   04/28/15 0645  cefoTEtan (CEFOTAN) 1 g in dextrose 5 % 50 mL IVPB     1 g 100 mL/hr over 30 Minutes Intravenous  Once 04/28/15 0637 04/28/15 0740      Assessment/Plan: s/p Procedure(s): APPENDECTOMY LAPAROSCOPIC (N/A) Advance diet Give IV Fluid bolus.    Keep overnight.  Hopefully discharge  tomorrow.   LOS: 1 day    [facility],Tamyka Bezio 04/29/2015

## 2015-04-29 NOTE — Progress Notes (Signed)
Patient reported that she vomited about 200 cc . Patient denies any pain at this time, abdomen distended. Encourage to slow down with her food and to stay of fluids and to abmbulate. Will continue to monitor.

## 2015-04-30 DIAGNOSIS — Z9049 Acquired absence of other specified parts of digestive tract: Secondary | ICD-10-CM

## 2015-04-30 DIAGNOSIS — K352 Acute appendicitis with generalized peritonitis: Secondary | ICD-10-CM | POA: Diagnosis not present

## 2015-04-30 LAB — BASIC METABOLIC PANEL
Anion gap: 7 (ref 5–15)
BUN: <5 mg/dL — ABNORMAL LOW (ref 6–23)
CALCIUM: 8.2 mg/dL — AB (ref 8.4–10.5)
CHLORIDE: 104 mmol/L (ref 96–112)
CO2: 25 mmol/L (ref 19–32)
Creatinine, Ser: 0.71 mg/dL (ref 0.50–1.10)
GFR calc Af Amer: >90 mL/min (ref >90)
GFR calc non Af Amer: >90 mL/min (ref >90)
Glucose, Bld: 120 mg/dL — ABNORMAL HIGH (ref 70–99)
Potassium: 3.7 mmol/L (ref 3.5–5.1)
SODIUM: 136 mmol/L (ref 135–145)

## 2015-04-30 LAB — CBC
HEMATOCRIT: 29.8 % — AB (ref 36.0–46.0)
HEMOGLOBIN: 10.3 g/dL — AB (ref 12.0–15.0)
MCH: 30.6 pg (ref 26.0–34.0)
MCHC: 34.6 g/dL (ref 30.0–36.0)
MCV: 88.4 fL (ref 78.0–100.0)
Platelets: 137 10*3/uL — ABNORMAL LOW (ref 150–400)
RBC: 3.37 MIL/uL — AB (ref 3.87–5.11)
RDW: 13.4 % (ref 11.5–15.5)
WBC: 10.8 10*3/uL — ABNORMAL HIGH (ref 4.0–10.5)

## 2015-04-30 MED ORDER — HYDROCODONE-ACETAMINOPHEN 5-325 MG PO TABS: 1.0000 | ORAL_TABLET | Freq: Four times a day (QID) | ORAL | Status: AC | PRN

## 2015-04-30 NOTE — Progress Notes (Signed)
Discharge instructions gone over with patient. Home medications gone over. Prescription given. Diet, incisional care, and reasons to call the doctor gone over. Patient has my chart. Patient verbalized understanding of instructions.

## 2015-04-30 NOTE — Discharge Summary (Signed)
Physician Discharge Summary  Patient ID: Taylor Murphy MRN: [id number] DOB/AGE: 48/06/1966 48 y.o.  Admit date: 04/28/2015 Discharge date: 04/30/2015  Admission Diagnoses:  Subacute appendicitis after perforation  Discharge Diagnoses:  same  Principal Problem:   S/P laparoscopic appendectomy-interval after perforation   Surgery:  Laparoscopic appendectomy  Discharged Condition: improved  Hospital Course:   Had surgery.  Unexplained hypotension after surgery that resolved.  Hg check and had rebounded on the day of discharge.  She was eating and eager to go home.    Consults: none  Significant Diagnostic Studies: path pending    Discharge Exam: Blood pressure 107/63, pulse 111, temperature 98.8 F (37.1 C), temperature source Oral, resp. rate 18, height 5\' 1"  (1.549 m), weight 57.108 kg (125 lb 14.4 oz), last menstrual period 04/03/2015, SpO2 100 %. Incision ok  Disposition: 01-Home or Self Care  Discharge Instructions    Diet - low sodium heart healthy    Complete by:  As directed      Discharge instructions    Complete by:  As directed   May shower Advance diet as tolerated     Increase activity slowly    Complete by:  As directed      Remove dressing in 24 hours    Complete by:  As directed             Medication List    STOP taking these medications        amoxicillin-clavulanate 875-125 MG per tablet  Commonly known as:  AUGMENTIN      TAKE these medications        Ferrous Gluconate 325 (36 FE) MG Tabs  Take 325 mg by mouth 2 (two) times daily.     HYDROcodone-acetaminophen 5-325 MG per tablet  Commonly known as:  NORCO/VICODIN  Take 1-2 tablets by mouth every 4 (four) hours as needed for moderate pain.     HYDROcodone-acetaminophen 5-325 MG per tablet  Commonly known as:  NORCO  Take 1 tablet by mouth every 6 (six) hours as needed.           Follow-up Information    [facility].   Specialty:  General Surgery   Contact information:   [address] [place] [address] [phone]       Signed: Valarie MerinoMARTIN,Merwin Breden B 04/30/2015, 10:21 AM

## 2015-05-09 ENCOUNTER — Ambulatory Visit: Payer: Self-pay | Admitting: Internal Medicine

## 2016-10-12 IMAGING — CT CT ABD-PELV W/ CM
3 of 5 series · 13 of 36 positions shown, 19 images · IV contrast (agent unspecified)
Comparison: CT scan of March 01, 2015.

CLINICAL DATA: Acute appendicitis with perforation and peritoneal
abscess.

EXAM:
CT ABDOMEN AND PELVIS WITH CONTRAST
TECHNIQUE: Multidetector CT imaging of the abdomen and pelvis was performed
using the standard protocol following bolus administration of
intravenous contrast.
CONTRAST:  100 mL of [id number] intravenously.

[Series 3: abd/pelvis with · axial · 0.65mm/px · z∈[-342,-2]mm · 8 of 88 slices shown, 13 images]
[im 10/88  soft-tissue]
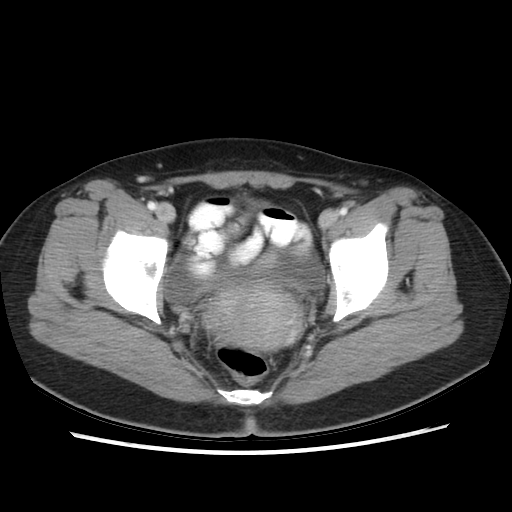
[im 10/88  bone]
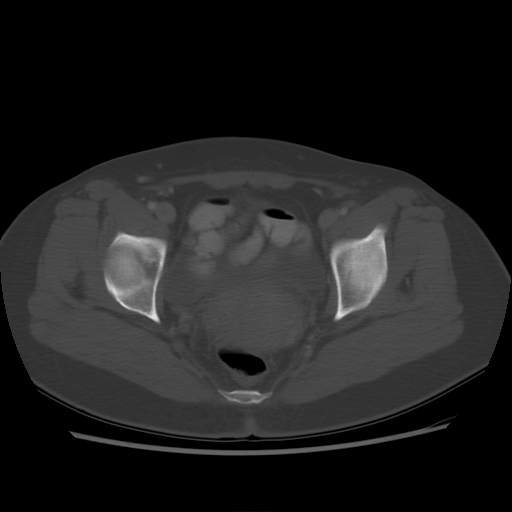
[im 20/88  soft-tissue]
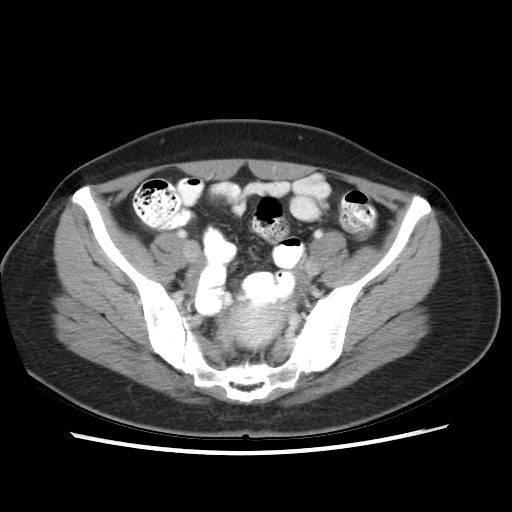
[im 30/88  soft-tissue]
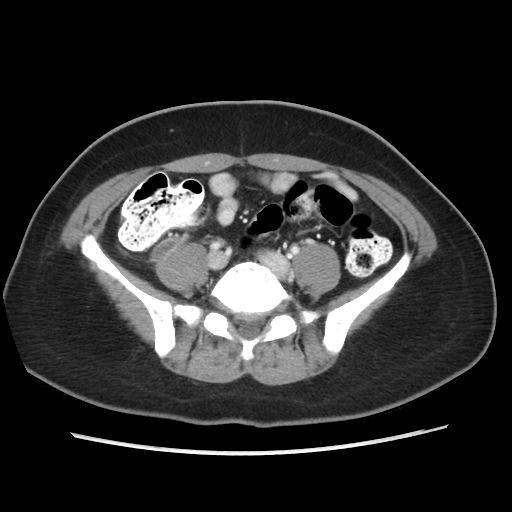
[im 39/88  soft-tissue]
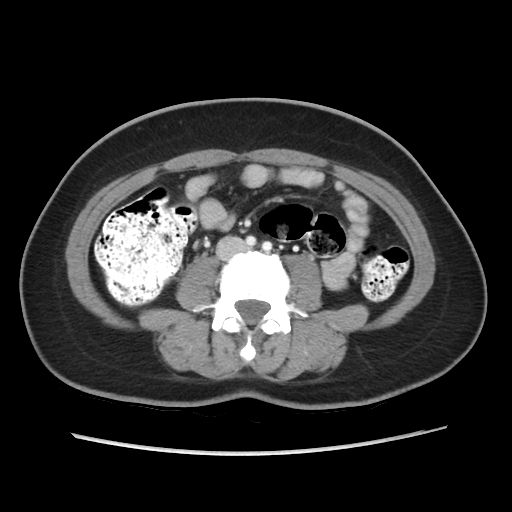
[im 49/88  soft-tissue]
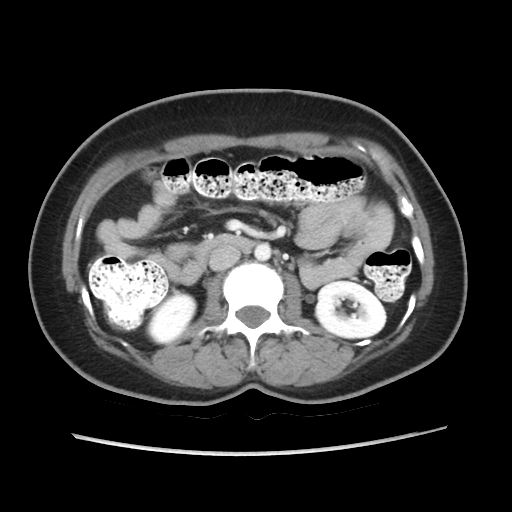
[im 49/88  lung]
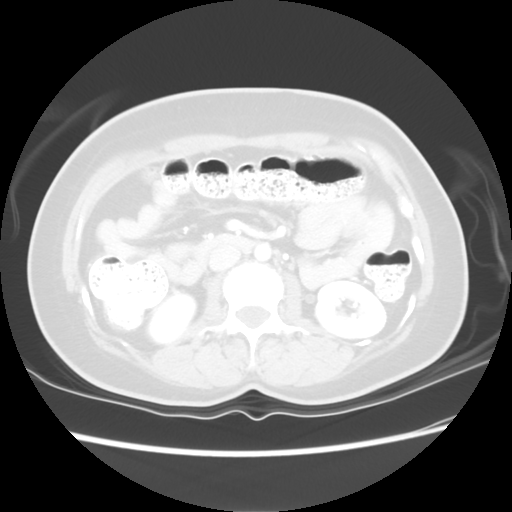
[im 59/88  soft-tissue]
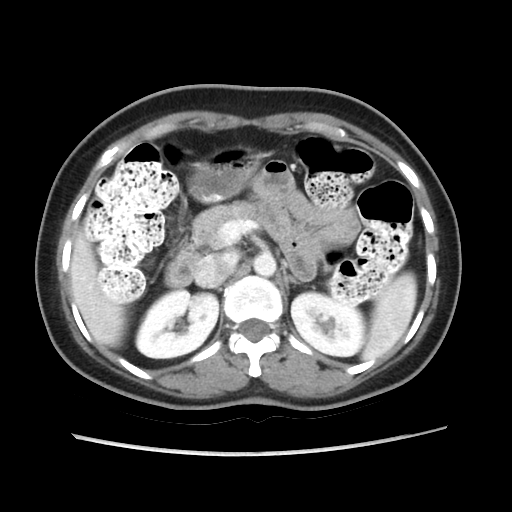
[im 59/88  lung]
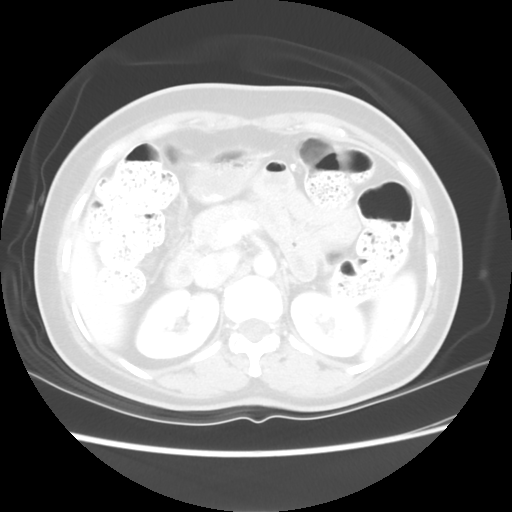
[im 68/88  soft-tissue]
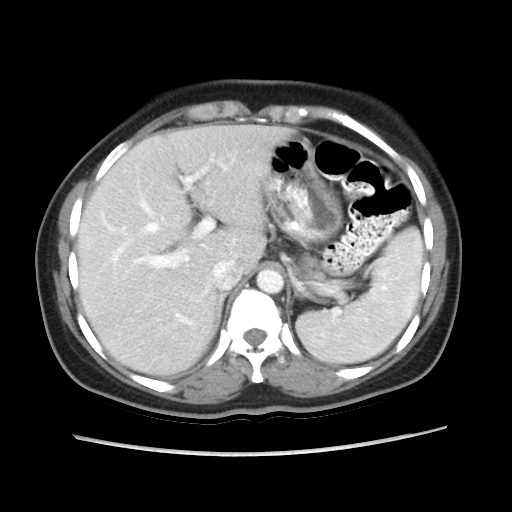
[im 68/88  lung]
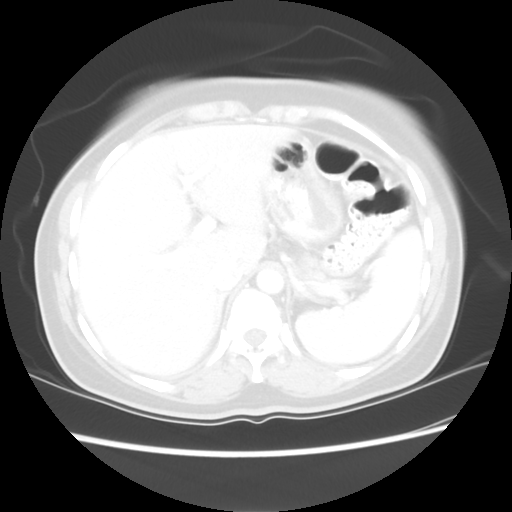
[im 78/88  soft-tissue]
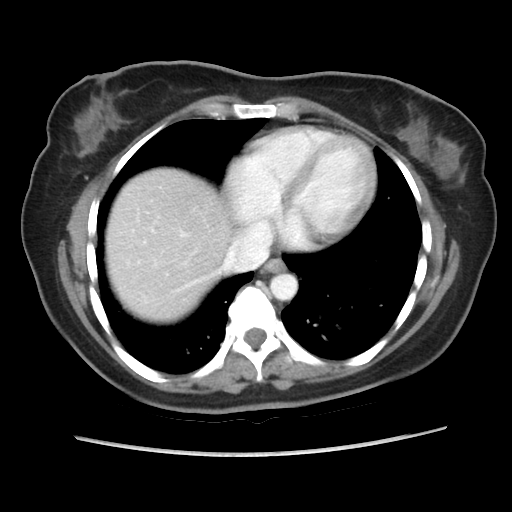
[im 78/88  lung]
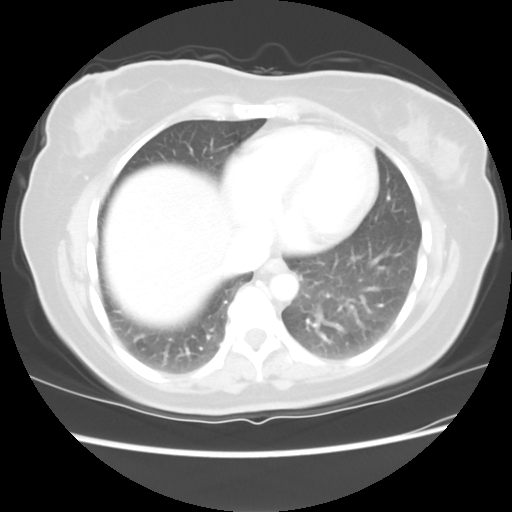

[Series 601: coronal body · coronal · 0.96mm/px · 1 of 100 slices shown, 2 images]
[im 34/100  soft-tissue]
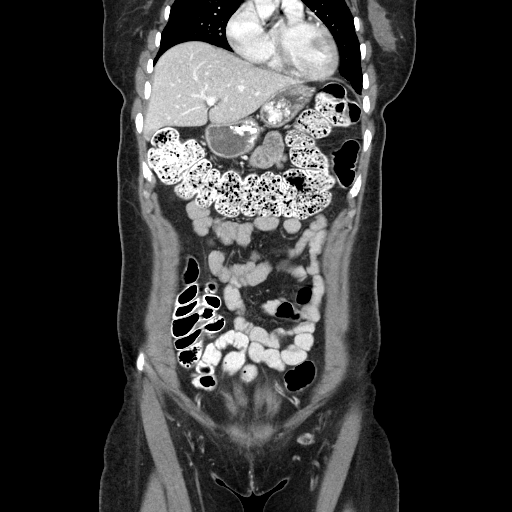
[im 34/100  bone]
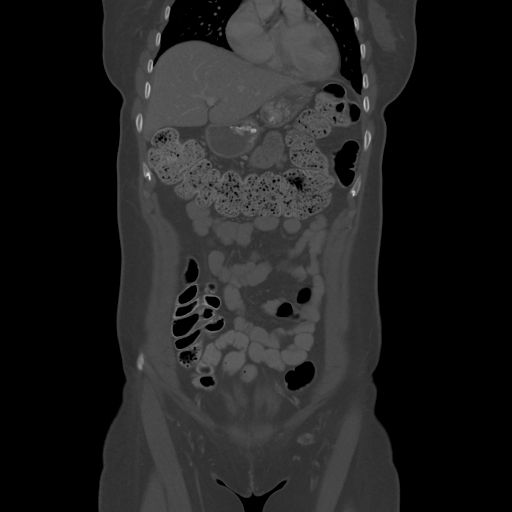

[Series 602: sagittal body · sagittal · 0.96mm/px · 4 of 134 slices shown]
[im 10/134  soft-tissue]
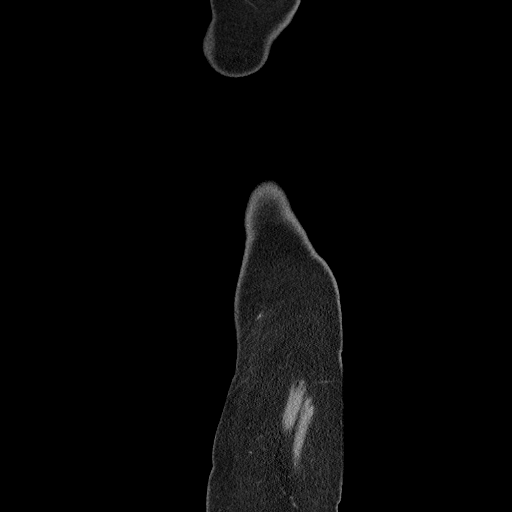
[im 29/134  soft-tissue]
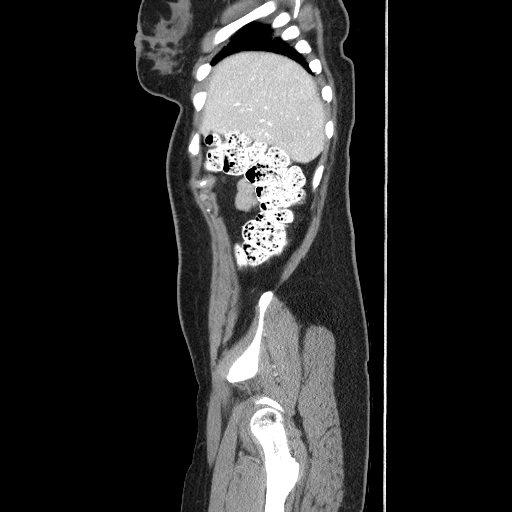
[im 48/134  soft-tissue]
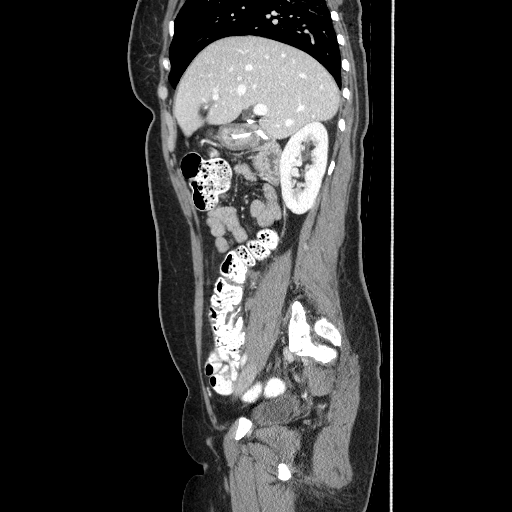
[im 58/134  soft-tissue]
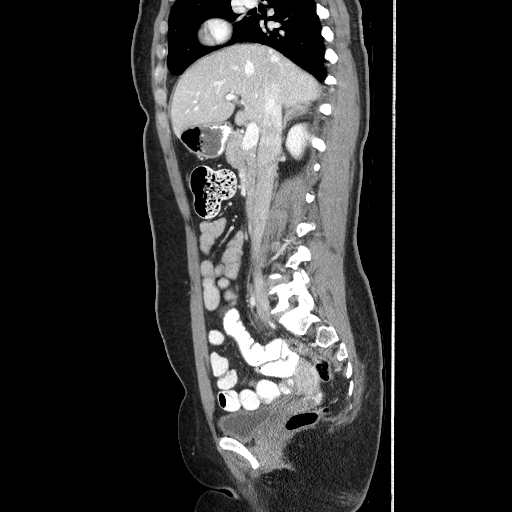

[13 of 36 positions shown; findings below may reference images not displayed]

FINDINGS: Visualized lung bases appear normal. No significant osseous
abnormality is noted.

Status post cholecystectomy. The liver, spleen and pancreas appear
normal. Adrenal glands and kidneys appear normal. No hydronephrosis
or renal obstruction is noted. There is no evidence of bowel
obstruction. Abdominal aorta appears normal. Uterus and urinary
bladder appear normal. Ovaries appear normal. No significant
adenopathy is noted.

Drainage catheter noted on prior exam has been removed. The proximal
portion of the appendix appears normal and contains contrast.
However, the more distal portion appears to be significantly dilated
measuring at least 11 mm with enhancing walls. Rounded fluid
collection measuring 2.4 x 2.3 cm is noted adjacent to this distal
segment suggesting residual abscess.
IMPRESSION: Percutaneous drain noted on prior exam has been removed. However,
there remains significant dilatation of the distal appendix with
enhancing margins suggesting inflammation. There is an adjacent
cm rounded fluid collection suggesting residual abscess and
perforation. These results will be called to the ordering clinician
or representative by the Radiologist Assistant, and communication
documented in the PACS or [employer] Dashboard.

## 2017-04-23 DIAGNOSIS — M5431 Sciatica, right side: Secondary | ICD-10-CM | POA: Diagnosis not present

## 2017-04-23 DIAGNOSIS — M545 Low back pain: Secondary | ICD-10-CM | POA: Diagnosis not present

## 2017-05-09 DIAGNOSIS — Z114 Encounter for screening for human immunodeficiency virus [HIV]: Secondary | ICD-10-CM | POA: Diagnosis not present

## 2017-05-09 DIAGNOSIS — Z0000 Encounter for general adult medical examination without abnormal findings: Secondary | ICD-10-CM | POA: Diagnosis not present

## 2017-05-10 MED FILL — ETODOLAC 400 MG TABLET: 400 | 30 days supply | Qty: 60 | Fill #0

## 2017-05-10 MED FILL — LATANOPROST 0.005% EYE DRP: 0.005 | 25 days supply | Qty: 3 | Fill #0

## 2017-06-26 MED FILL — LATANOPROST 0.005% EYE DRP: 0.005 | 25 days supply | Qty: 3 | Fill #1

## 2017-09-03 DIAGNOSIS — H409 Unspecified glaucoma: Secondary | ICD-10-CM | POA: Diagnosis not present

## 2017-09-03 DIAGNOSIS — Z0000 Encounter for general adult medical examination without abnormal findings: Secondary | ICD-10-CM | POA: Diagnosis not present

## 2017-09-25 DIAGNOSIS — Z0000 Encounter for general adult medical examination without abnormal findings: Secondary | ICD-10-CM | POA: Diagnosis not present

## 2017-10-08 DIAGNOSIS — N841 Polyp of cervix uteri: Secondary | ICD-10-CM | POA: Diagnosis not present

## 2017-10-08 DIAGNOSIS — R87611 Atypical squamous cells cannot exclude high grade squamous intraepithelial lesion on cytologic smear of cervix (ASC-H): Secondary | ICD-10-CM | POA: Diagnosis not present

## 2017-10-08 DIAGNOSIS — Z01411 Encounter for gynecological examination (general) (routine) with abnormal findings: Secondary | ICD-10-CM | POA: Diagnosis not present

## 2017-12-26 MED FILL — LATANOPROST 0.005% EYE DRP: 0.005 | 25 days supply | Qty: 3 | Fill #2

## 2018-01-23 ENCOUNTER — Other Ambulatory Visit: Payer: Self-pay | Admitting: Obstetrics & Gynecology

## 2018-04-08 MED FILL — LATANOPROST 0.005% EYE DRP: 0.005 | 25 days supply | Qty: 3 | Fill #0

## 2018-07-23 MED FILL — LATANOPROST 0.005% EYE DRP: 0.005 | 25 days supply | Qty: 3 | Fill #1

## 2018-10-06 MED FILL — LATANOPROST 0.005% EYE DRP: 0.005 | 25 days supply | Qty: 3 | Fill #2

## 2018-11-13 MED FILL — VIT D3-50 50,000 UNITS CAPS: 1.25 MG | 84 days supply | Qty: 12 | Fill #0

## 2019-01-02 MED FILL — LATANOPROST 0.005% EYE DRP: 0.005 | 25 days supply | Qty: 3 | Fill #3

## 2019-01-05 ENCOUNTER — Other Ambulatory Visit: Payer: Self-pay | Admitting: Obstetrics & Gynecology

## 2019-03-31 MED FILL — LATANOPROST 0.005% EYE DRP: 0.005 | 25 days supply | Qty: 3 | Fill #0

## 2019-06-19 MED FILL — LATANOPROST 0.005% EYE DRP: 0.005 | 25 days supply | Qty: 3 | Fill #1

## 2019-10-01 MED FILL — LATANOPROST 0.005% EYE DRP: 0.005 | 25 days supply | Qty: 3 | Fill #2

## 2019-12-21 MED FILL — LATANOPROST 0.005% OPTH SOL: 0.005 | 25 days supply | Qty: 3 | Fill #3

## 2020-01-15 ENCOUNTER — Other Ambulatory Visit: Payer: Self-pay | Admitting: Obstetrics & Gynecology

## 2020-01-15 DIAGNOSIS — Z1231 Encounter for screening mammogram for malignant neoplasm of breast: Secondary | ICD-10-CM

## 2020-02-19 ENCOUNTER — Ambulatory Visit: Payer: Self-pay

## 2020-03-03 MED FILL — LATANOPROST 0.005% OPTH SOL: 0.005 | 25 days supply | Qty: 3 | Fill #0

## 2020-03-08 MED FILL — AMOX-CLAV 875-125 MG TABLET: 875-125 | 10 days supply | Qty: 20 | Fill #0

## 2020-03-24 ENCOUNTER — Other Ambulatory Visit: Payer: Self-pay

## 2020-03-24 ENCOUNTER — Ambulatory Visit
Admission: RE | Admit: 2020-03-24 | Discharge: 2020-03-24 | Disposition: A | Discharge status: Home / Self Care | Payer: No Typology Code available for payment source | Source: Ambulatory Visit | Attending: Obstetrics & Gynecology | Admitting: Obstetrics & Gynecology

## 2020-03-24 DIAGNOSIS — Z1231 Encounter for screening mammogram for malignant neoplasm of breast: Secondary | ICD-10-CM

## 2020-03-25 MED FILL — LATANOPROST 0.005% OPTH SOL: 0.005 | 75 days supply | Qty: 8 | Fill #0

## 2020-05-16 ENCOUNTER — Ambulatory Visit: Payer: No Typology Code available for payment source | Attending: Internal Medicine

## 2020-05-16 DIAGNOSIS — Z20822 Contact with and (suspected) exposure to covid-19: Secondary | ICD-10-CM

## 2020-05-17 LAB — SARS-COV-2, NAA 2 DAY TAT

## 2020-05-17 LAB — NOVEL CORONAVIRUS, NAA: SARS-CoV-2, NAA: NOT DETECTED

## 2020-09-23 ENCOUNTER — Other Ambulatory Visit: Payer: No Typology Code available for payment source

## 2020-09-23 DIAGNOSIS — Z20822 Contact with and (suspected) exposure to covid-19: Secondary | ICD-10-CM

## 2020-09-25 LAB — NOVEL CORONAVIRUS, NAA: SARS-CoV-2, NAA: NOT DETECTED

## 2020-09-25 LAB — SARS-COV-2, NAA 2 DAY TAT

## 2020-12-12 MED FILL — LATANOPROST 0.005% OPTH SOL: 0.005 | 75 days supply | Qty: 8 | Fill #1

## 2021-01-19 ENCOUNTER — Other Ambulatory Visit: Payer: Self-pay | Admitting: Obstetrics and Gynecology

## 2021-01-19 DIAGNOSIS — Z1231 Encounter for screening mammogram for malignant neoplasm of breast: Secondary | ICD-10-CM

## 2021-02-09 ENCOUNTER — Other Ambulatory Visit (HOSPITAL_BASED_OUTPATIENT_CLINIC_OR_DEPARTMENT_OTHER): Payer: Self-pay | Admitting: Ophthalmology

## 2021-02-10 MED FILL — LATANOPROST 0.005% EYE DRP: 0.005 | 25 days supply | Qty: 3 | Fill #0

## 2021-03-27 ENCOUNTER — Inpatient Hospital Stay: Admission: RE | Admit: 2021-03-27 | Payer: No Typology Code available for payment source | Source: Ambulatory Visit

## 2021-05-23 ENCOUNTER — Other Ambulatory Visit: Payer: Self-pay

## 2021-05-23 ENCOUNTER — Ambulatory Visit
Admission: RE | Admit: 2021-05-23 | Discharge: 2021-05-23 | Disposition: A | Discharge status: Home / Self Care | Payer: No Typology Code available for payment source | Source: Ambulatory Visit | Attending: Obstetrics and Gynecology | Admitting: Obstetrics and Gynecology

## 2021-05-23 DIAGNOSIS — Z1231 Encounter for screening mammogram for malignant neoplasm of breast: Secondary | ICD-10-CM

## 2021-08-24 ENCOUNTER — Other Ambulatory Visit (HOSPITAL_BASED_OUTPATIENT_CLINIC_OR_DEPARTMENT_OTHER): Payer: Self-pay

## 2021-08-24 MED FILL — Latanoprost Ophth Soln 0.005%: OPHTHALMIC | 25 days supply | Qty: 2.5 | Fill #0 | Status: AC

## 2021-12-22 ENCOUNTER — Telehealth: Payer: No Typology Code available for payment source | Admitting: Physician Assistant

## 2021-12-22 DIAGNOSIS — J069 Acute upper respiratory infection, unspecified: Secondary | ICD-10-CM

## 2021-12-22 MED ORDER — BENZONATATE 100 MG PO CAPS: 100.0000 mg | ORAL_CAPSULE | Freq: Three times a day (TID) | ORAL | 0 refills | Status: AC | PRN

## 2021-12-22 MED ORDER — IPRATROPIUM BROMIDE 0.03 % NA SOLN: 2.0000 | Freq: Two times a day (BID) | NASAL | 0 refills | Status: AC

## 2021-12-22 NOTE — Progress Notes (Signed)

## 2022-02-21 ENCOUNTER — Other Ambulatory Visit (HOSPITAL_BASED_OUTPATIENT_CLINIC_OR_DEPARTMENT_OTHER): Payer: Self-pay

## 2022-02-21 MED ORDER — LATANOPROST 0.005 % OP SOLN
OPHTHALMIC | 3 refills | Status: AC
  Filled 2022-02-21: qty 7.5, 75d supply, fill #0

## 2022-02-22 ENCOUNTER — Other Ambulatory Visit (HOSPITAL_BASED_OUTPATIENT_CLINIC_OR_DEPARTMENT_OTHER): Payer: Self-pay

## 2022-03-22 ENCOUNTER — Other Ambulatory Visit (HOSPITAL_COMMUNITY)
Admission: RE | Admit: 2022-03-22 | Discharge: 2022-03-22 | Disposition: A | Discharge status: Home / Self Care | Payer: No Typology Code available for payment source | Source: Ambulatory Visit | Attending: Nurse Practitioner | Admitting: Nurse Practitioner

## 2022-03-22 ENCOUNTER — Other Ambulatory Visit: Payer: Self-pay | Admitting: Nurse Practitioner

## 2022-03-22 DIAGNOSIS — Z8742 Personal history of other diseases of the female genital tract: Secondary | ICD-10-CM | POA: Diagnosis present

## 2022-03-26 LAB — CYTOLOGY - PAP
Comment: NEGATIVE
Diagnosis: NEGATIVE
High risk HPV: NEGATIVE

## 2022-03-28 ENCOUNTER — Other Ambulatory Visit: Payer: Self-pay | Admitting: Family Medicine

## 2022-03-28 DIAGNOSIS — Z1231 Encounter for screening mammogram for malignant neoplasm of breast: Secondary | ICD-10-CM

## 2022-05-31 ENCOUNTER — Ambulatory Visit
Admission: RE | Admit: 2022-05-31 | Discharge: 2022-05-31 | Disposition: A | Discharge status: Home / Self Care | Payer: No Typology Code available for payment source | Source: Ambulatory Visit | Attending: Family Medicine | Admitting: Family Medicine

## 2022-05-31 DIAGNOSIS — Z1231 Encounter for screening mammogram for malignant neoplasm of breast: Secondary | ICD-10-CM

## 2022-06-04 ENCOUNTER — Other Ambulatory Visit: Payer: Self-pay | Admitting: Family Medicine

## 2022-06-04 DIAGNOSIS — R928 Other abnormal and inconclusive findings on diagnostic imaging of breast: Secondary | ICD-10-CM

## 2022-06-08 ENCOUNTER — Ambulatory Visit
Admission: RE | Admit: 2022-06-08 | Discharge: 2022-06-08 | Disposition: A | Discharge status: Home / Self Care | Payer: No Typology Code available for payment source | Source: Ambulatory Visit | Attending: Family Medicine | Admitting: Family Medicine

## 2022-06-08 DIAGNOSIS — R928 Other abnormal and inconclusive findings on diagnostic imaging of breast: Secondary | ICD-10-CM

## 2022-07-19 ENCOUNTER — Other Ambulatory Visit (HOSPITAL_BASED_OUTPATIENT_CLINIC_OR_DEPARTMENT_OTHER): Payer: Self-pay

## 2022-07-19 MED ORDER — LATANOPROST 0.005 % OP SOLN
OPHTHALMIC | 4 refills | Status: AC
  Filled 2022-07-19: qty 7.5, 75d supply, fill #0
  Filled 2023-03-21: qty 7.5, 75d supply, fill #1

## 2022-08-22 ENCOUNTER — Other Ambulatory Visit (HOSPITAL_BASED_OUTPATIENT_CLINIC_OR_DEPARTMENT_OTHER): Payer: Self-pay

## 2022-08-22 MED ORDER — NAPROXEN 500 MG PO TABS
500.0000 mg | ORAL_TABLET | Freq: Two times a day (BID) | ORAL | 0 refills | Status: AC
  Filled 2022-08-22: qty 20, 10d supply, fill #0

## 2023-03-21 ENCOUNTER — Other Ambulatory Visit (HOSPITAL_BASED_OUTPATIENT_CLINIC_OR_DEPARTMENT_OTHER): Payer: Self-pay

## 2023-04-15 DIAGNOSIS — Z13220 Encounter for screening for lipoid disorders: Secondary | ICD-10-CM | POA: Diagnosis not present

## 2023-04-15 DIAGNOSIS — Z0000 Encounter for general adult medical examination without abnormal findings: Secondary | ICD-10-CM | POA: Diagnosis not present

## 2023-04-15 DIAGNOSIS — H401131 Primary open-angle glaucoma, bilateral, mild stage: Secondary | ICD-10-CM | POA: Diagnosis not present

## 2023-04-15 DIAGNOSIS — Z131 Encounter for screening for diabetes mellitus: Secondary | ICD-10-CM | POA: Diagnosis not present

## 2023-04-15 DIAGNOSIS — E559 Vitamin D deficiency, unspecified: Secondary | ICD-10-CM | POA: Diagnosis not present

## 2023-05-03 ENCOUNTER — Other Ambulatory Visit (HOSPITAL_BASED_OUTPATIENT_CLINIC_OR_DEPARTMENT_OTHER): Payer: Self-pay

## 2023-05-03 DIAGNOSIS — D649 Anemia, unspecified: Secondary | ICD-10-CM | POA: Diagnosis not present

## 2023-05-03 DIAGNOSIS — M25471 Effusion, right ankle: Secondary | ICD-10-CM | POA: Diagnosis not present

## 2023-05-03 MED ORDER — FAMOTIDINE 40 MG PO TABS
40.0000 mg | ORAL_TABLET | Freq: Every day | ORAL | 0 refills | Status: AC
  Filled 2023-05-03: qty 30, 30d supply, fill #0

## 2023-05-03 MED ORDER — PREDNISONE 10 MG PO TABS
10.0000 mg | ORAL_TABLET | ORAL | 0 refills | Status: AC
  Filled 2023-05-03: qty 21, 6d supply, fill #0

## 2023-06-28 DIAGNOSIS — Z1151 Encounter for screening for human papillomavirus (HPV): Secondary | ICD-10-CM | POA: Diagnosis not present

## 2023-06-28 DIAGNOSIS — Z1389 Encounter for screening for other disorder: Secondary | ICD-10-CM | POA: Diagnosis not present

## 2023-06-28 DIAGNOSIS — Z01419 Encounter for gynecological examination (general) (routine) without abnormal findings: Secondary | ICD-10-CM | POA: Diagnosis not present

## 2023-06-28 DIAGNOSIS — Z124 Encounter for screening for malignant neoplasm of cervix: Secondary | ICD-10-CM | POA: Diagnosis not present

## 2023-07-01 DIAGNOSIS — Z1231 Encounter for screening mammogram for malignant neoplasm of breast: Secondary | ICD-10-CM | POA: Diagnosis not present

## 2023-08-20 DIAGNOSIS — H40053 Ocular hypertension, bilateral: Secondary | ICD-10-CM | POA: Diagnosis not present

## 2023-12-16 ENCOUNTER — Other Ambulatory Visit: Payer: Self-pay

## 2024-04-23 DIAGNOSIS — D649 Anemia, unspecified: Secondary | ICD-10-CM | POA: Diagnosis not present

## 2024-04-23 DIAGNOSIS — E559 Vitamin D deficiency, unspecified: Secondary | ICD-10-CM | POA: Diagnosis not present

## 2024-04-23 DIAGNOSIS — Z0000 Encounter for general adult medical examination without abnormal findings: Secondary | ICD-10-CM | POA: Diagnosis not present

## 2024-05-18 ENCOUNTER — Other Ambulatory Visit (HOSPITAL_BASED_OUTPATIENT_CLINIC_OR_DEPARTMENT_OTHER): Payer: Self-pay

## 2024-05-18 MED ORDER — TETANUS-DIPHTH-ACELL PERTUSSIS 5-2.5-18.5 LF-MCG/0.5 IM SUSY
0.5000 mL | PREFILLED_SYRINGE | Freq: Once | INTRAMUSCULAR | 0 refills | Status: AC
  Filled 2024-05-18: qty 0.5, 1d supply, fill #0

## 2024-05-18 MED ORDER — ZOSTER VAC RECOMB ADJUVANTED 50 MCG/0.5ML IM SUSR
0.5000 mL | Freq: Once | INTRAMUSCULAR | 0 refills | Status: AC
  Filled 2024-05-18: qty 0.5, 1d supply, fill #0

## 2024-07-06 DIAGNOSIS — Z01419 Encounter for gynecological examination (general) (routine) without abnormal findings: Secondary | ICD-10-CM | POA: Diagnosis not present

## 2024-07-06 DIAGNOSIS — Z130 Encounter for screening for diseases of the blood and blood-forming organs and certain disorders involving the immune mechanism: Secondary | ICD-10-CM | POA: Diagnosis not present

## 2024-07-06 DIAGNOSIS — Z1231 Encounter for screening mammogram for malignant neoplasm of breast: Secondary | ICD-10-CM | POA: Diagnosis not present

## 2024-07-06 DIAGNOSIS — Z1389 Encounter for screening for other disorder: Secondary | ICD-10-CM | POA: Diagnosis not present

## 2024-08-26 ENCOUNTER — Other Ambulatory Visit (HOSPITAL_BASED_OUTPATIENT_CLINIC_OR_DEPARTMENT_OTHER): Payer: Self-pay

## 2024-08-26 MED ORDER — LATANOPROST 0.005 % OP SOLN
1.0000 [drp] | Freq: Every evening | OPHTHALMIC | 0 refills | Status: AC
  Filled 2024-08-26: qty 2.5, 42d supply, fill #0

## 2024-11-02 ENCOUNTER — Other Ambulatory Visit (HOSPITAL_BASED_OUTPATIENT_CLINIC_OR_DEPARTMENT_OTHER): Payer: Self-pay

## 2024-11-02 MED ORDER — SHINGRIX 50 MCG/0.5ML IM SUSR
0.5000 mL | Freq: Once | INTRAMUSCULAR | 0 refills | Status: AC
  Filled 2024-11-02: qty 0.5, 1d supply, fill #0

## 2024-11-06 DIAGNOSIS — H40053 Ocular hypertension, bilateral: Secondary | ICD-10-CM | POA: Diagnosis not present

## 2024-11-10 ENCOUNTER — Other Ambulatory Visit (HOSPITAL_BASED_OUTPATIENT_CLINIC_OR_DEPARTMENT_OTHER): Payer: Self-pay

## 2024-11-10 DIAGNOSIS — H40051 Ocular hypertension, right eye: Secondary | ICD-10-CM | POA: Diagnosis not present

## 2025-01-13 ENCOUNTER — Other Ambulatory Visit (HOSPITAL_BASED_OUTPATIENT_CLINIC_OR_DEPARTMENT_OTHER): Payer: Self-pay

## 2025-01-13 MED ORDER — LATANOPROST 0.005 % OP SOLN
1.0000 [drp] | Freq: Every evening | OPHTHALMIC | 5 refills | Status: AC
  Filled 2025-01-13: qty 2.5, 25d supply, fill #0
